# Patient Record
Sex: Male | Born: 1980 | Race: Black or African American | Hispanic: No | Marital: Married | Smoking: Former smoker
Health system: Southern US, Community
[De-identification: ages and names within clinical notes are randomized; demographics above are authoritative.]

## PROBLEM LIST (undated history)

## (undated) DIAGNOSIS — I214 Non-ST elevation (NSTEMI) myocardial infarction: Secondary | ICD-10-CM

## (undated) DIAGNOSIS — I10 Essential (primary) hypertension: Secondary | ICD-10-CM

## (undated) DIAGNOSIS — E119 Type 2 diabetes mellitus without complications: Secondary | ICD-10-CM

## (undated) HISTORY — PX: INCISE AND DRAIN ABCESS: PRO64

## (undated) HISTORY — PX: OTHER SURGICAL HISTORY: SHX169

## (undated) HISTORY — PX: EXTERNAL EAR SURGERY: SHX627

## (undated) HISTORY — DX: Non-ST elevation (NSTEMI) myocardial infarction: I21.4

---

## 2002-06-20 ENCOUNTER — Emergency Department (HOSPITAL_COMMUNITY): Admission: EM | Admit: 2002-06-20 | Discharge: 2002-06-20 | Payer: Self-pay | Admitting: Emergency Medicine

## 2002-11-17 ENCOUNTER — Emergency Department (HOSPITAL_COMMUNITY): Admission: EM | Admit: 2002-11-17 | Discharge: 2002-11-17 | Payer: Self-pay | Admitting: Emergency Medicine

## 2002-12-16 ENCOUNTER — Encounter: Payer: Self-pay | Admitting: *Deleted

## 2002-12-16 ENCOUNTER — Emergency Department (HOSPITAL_COMMUNITY): Admission: EM | Admit: 2002-12-16 | Discharge: 2002-12-16 | Payer: Self-pay | Admitting: *Deleted

## 2003-01-10 ENCOUNTER — Emergency Department (HOSPITAL_COMMUNITY): Admission: EM | Admit: 2003-01-10 | Discharge: 2003-01-10 | Payer: Self-pay | Admitting: *Deleted

## 2003-03-10 ENCOUNTER — Emergency Department (HOSPITAL_COMMUNITY): Admission: EM | Admit: 2003-03-10 | Discharge: 2003-03-10 | Payer: Self-pay | Admitting: *Deleted

## 2003-03-10 ENCOUNTER — Encounter: Payer: Self-pay | Admitting: *Deleted

## 2003-05-01 ENCOUNTER — Emergency Department (HOSPITAL_COMMUNITY): Admission: EM | Admit: 2003-05-01 | Discharge: 2003-05-01 | Payer: Self-pay | Admitting: Emergency Medicine

## 2003-05-01 ENCOUNTER — Encounter: Payer: Self-pay | Admitting: Emergency Medicine

## 2003-05-10 ENCOUNTER — Emergency Department (HOSPITAL_COMMUNITY): Admission: EM | Admit: 2003-05-10 | Discharge: 2003-05-10 | Payer: Self-pay | Admitting: Emergency Medicine

## 2003-11-20 ENCOUNTER — Emergency Department (HOSPITAL_COMMUNITY): Admission: EM | Admit: 2003-11-20 | Discharge: 2003-11-20 | Payer: Self-pay | Admitting: Sports Medicine

## 2005-03-13 ENCOUNTER — Emergency Department (HOSPITAL_COMMUNITY): Admission: EM | Admit: 2005-03-13 | Discharge: 2005-03-13 | Payer: Self-pay | Admitting: Emergency Medicine

## 2006-09-04 ENCOUNTER — Emergency Department (HOSPITAL_COMMUNITY): Admission: EM | Admit: 2006-09-04 | Discharge: 2006-09-04 | Payer: Self-pay | Admitting: Emergency Medicine

## 2006-11-07 ENCOUNTER — Emergency Department (HOSPITAL_COMMUNITY): Admission: EM | Admit: 2006-11-07 | Discharge: 2006-11-07 | Payer: Self-pay | Admitting: Emergency Medicine

## 2006-11-09 ENCOUNTER — Emergency Department (HOSPITAL_COMMUNITY): Admission: EM | Admit: 2006-11-09 | Discharge: 2006-11-09 | Payer: Self-pay | Admitting: Emergency Medicine

## 2007-04-11 ENCOUNTER — Emergency Department (HOSPITAL_COMMUNITY): Admission: EM | Admit: 2007-04-11 | Discharge: 2007-04-11 | Payer: Self-pay | Admitting: Emergency Medicine

## 2007-06-05 ENCOUNTER — Emergency Department (HOSPITAL_COMMUNITY): Admission: EM | Admit: 2007-06-05 | Discharge: 2007-06-05 | Payer: Self-pay | Admitting: Emergency Medicine

## 2008-06-10 ENCOUNTER — Emergency Department (HOSPITAL_COMMUNITY): Admission: EM | Admit: 2008-06-10 | Discharge: 2008-06-10 | Payer: Self-pay | Admitting: Emergency Medicine

## 2009-05-03 ENCOUNTER — Emergency Department (HOSPITAL_COMMUNITY): Admission: EM | Admit: 2009-05-03 | Discharge: 2009-05-03 | Payer: Self-pay | Admitting: Emergency Medicine

## 2010-07-21 ENCOUNTER — Emergency Department (HOSPITAL_COMMUNITY): Admission: EM | Admit: 2010-07-21 | Discharge: 2010-07-21 | Payer: Self-pay | Admitting: Emergency Medicine

## 2010-09-02 DEATH — deceased

## 2010-09-05 ENCOUNTER — Emergency Department (HOSPITAL_COMMUNITY): Admission: EM | Admit: 2010-09-05 | Discharge: 2010-09-05 | Payer: Self-pay | Admitting: Emergency Medicine

## 2011-01-13 LAB — CBC
Platelets: 181 10*3/uL (ref 150–400)
RBC: 4.88 MIL/uL (ref 4.22–5.81)
RDW: 13.6 % (ref 11.5–15.5)
WBC: 8.2 10*3/uL (ref 4.0–10.5)

## 2011-01-13 LAB — COMPREHENSIVE METABOLIC PANEL
ALT: 20 U/L (ref 0–53)
AST: 23 U/L (ref 0–37)
Albumin: 4.2 g/dL (ref 3.5–5.2)
Alkaline Phosphatase: 98 U/L (ref 39–117)
Chloride: 104 mEq/L (ref 96–112)
Potassium: 3.7 mEq/L (ref 3.5–5.1)
Sodium: 139 mEq/L (ref 135–145)
Total Bilirubin: 0.6 mg/dL (ref 0.3–1.2)
Total Protein: 7.2 g/dL (ref 6.0–8.3)

## 2011-01-13 LAB — DIFFERENTIAL
Basophils Relative: 1 % (ref 0–1)
Eosinophils Absolute: 0.1 10*3/uL (ref 0.0–0.7)
Eosinophils Relative: 1 % (ref 0–5)
Monocytes Absolute: 0.7 10*3/uL (ref 0.1–1.0)
Monocytes Relative: 8 % (ref 3–12)
Neutro Abs: 4.9 10*3/uL (ref 1.7–7.7)

## 2011-08-17 LAB — CBC
Hemoglobin: 14.2
MCHC: 33.9
Platelets: 169
RDW: 13.1

## 2011-08-17 LAB — DIFFERENTIAL
Basophils Absolute: 0.1
Eosinophils Absolute: 0.2
Lymphocytes Relative: 21
Neutro Abs: 6.6

## 2011-11-28 ENCOUNTER — Emergency Department (HOSPITAL_COMMUNITY)
Admission: EM | Admit: 2011-11-28 | Discharge: 2011-11-28 | Disposition: A | Discharge status: Home / Self Care | Payer: Self-pay | Attending: Emergency Medicine | Admitting: Emergency Medicine

## 2011-11-28 ENCOUNTER — Encounter (HOSPITAL_COMMUNITY): Payer: Self-pay

## 2011-11-28 DIAGNOSIS — S335XXA Sprain of ligaments of lumbar spine, initial encounter: Secondary | ICD-10-CM | POA: Insufficient documentation

## 2011-11-28 DIAGNOSIS — X500XXA Overexertion from strenuous movement or load, initial encounter: Secondary | ICD-10-CM | POA: Insufficient documentation

## 2011-11-28 DIAGNOSIS — M5380 Other specified dorsopathies, site unspecified: Secondary | ICD-10-CM | POA: Insufficient documentation

## 2011-11-28 DIAGNOSIS — S39012A Strain of muscle, fascia and tendon of lower back, initial encounter: Secondary | ICD-10-CM

## 2011-11-28 DIAGNOSIS — F17200 Nicotine dependence, unspecified, uncomplicated: Secondary | ICD-10-CM | POA: Insufficient documentation

## 2011-11-28 DIAGNOSIS — S161XXA Strain of muscle, fascia and tendon at neck level, initial encounter: Secondary | ICD-10-CM

## 2011-11-28 MED ORDER — IBUPROFEN 800 MG PO TABS: 800.0000 mg | ORAL_TABLET | Freq: Three times a day (TID) | ORAL | Status: AC

## 2011-11-28 MED ORDER — KETOROLAC TROMETHAMINE 60 MG/2ML IM SOLN
60.0000 mg | Freq: Once | INTRAMUSCULAR | Status: AC
  Administered 2011-11-28: 60 mg via INTRAMUSCULAR
  Filled 2011-11-28: qty 2

## 2011-11-28 MED ORDER — CYCLOBENZAPRINE HCL 10 MG PO TABS: 10.0000 mg | ORAL_TABLET | Freq: Three times a day (TID) | ORAL | Status: AC | PRN

## 2011-11-28 MED ORDER — CYCLOBENZAPRINE HCL 10 MG PO TABS
10.0000 mg | ORAL_TABLET | Freq: Once | ORAL | Status: AC
  Administered 2011-11-28: 10 mg via ORAL
  Filled 2011-11-28: qty 1

## 2011-11-28 MED ORDER — HYDROCODONE-ACETAMINOPHEN 5-325 MG PO TABS: 1.0000 | ORAL_TABLET | ORAL | Status: AC | PRN

## 2011-11-28 NOTE — ED Notes (Signed)
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  Pt says this am back pain intensified.

## 2011-11-28 NOTE — ED Provider Notes (Signed)
History     CSN: [id number]  Arrival date & time 11/28/11  8469   First MD Initiated Contact with Patient 11/28/11 [phone]      Chief Complaint  Patient presents with  . Back Pain    (Consider location/radiation/quality/duration/timing/severity/associated sxs/prior treatment) HPI Comments: Patient here after lifting heavy boxes starting Thursday - states he works three jobs, states has also been cutting firewood for at home and is now having worsening pain with movement, denies numbness, reports tingling that occurred "once" and denies weakness.  States knows he is not lifting right.  Patient is a 31 y.o. male presenting with back pain. The history is provided by the patient. No language interpreter was used.  Back Pain  This is a new problem. The current episode started yesterday. The problem occurs constantly. The problem has not changed since onset.The pain is associated with lifting heavy objects. The pain is present in the lumbar spine. The quality of the pain is described as aching and cramping. The pain does not radiate. The pain is at a severity of 6/10. The pain is moderate. The symptoms are aggravated by bending and twisting. The pain is the same all the time. Stiffness is present in the morning. Associated symptoms include tingling. Pertinent negatives include no chest pain, no fever, no numbness, no weight loss, no headaches, no abdominal pain, no abdominal swelling, no bowel incontinence, no perianal numbness, no bladder incontinence, no dysuria, no pelvic pain, no leg pain, no paresthesias, no paresis and no weakness. He has tried NSAIDs for the symptoms. The treatment provided no relief.    History reviewed. No pertinent past medical history.  Past Surgical History  Procedure Date  . External ear surgery     No family history on file.  History  Substance Use Topics  . Smoking status: Current Everyday Smoker  . Smokeless tobacco: Not on file  . Alcohol Use: No       Review of Systems  Constitutional: Negative for fever and weight loss.  Cardiovascular: Negative for chest pain.  Gastrointestinal: Negative for abdominal pain and bowel incontinence.  Genitourinary: Negative for bladder incontinence, dysuria and pelvic pain.  Musculoskeletal: Positive for back pain.  Neurological: Positive for tingling. Negative for weakness, numbness, headaches and paresthesias.  All other systems reviewed and are negative.    Allergies  Review of patient's allergies indicates no known allergies.  Home Medications  No current outpatient prescriptions on file.  BP 131/82  Pulse 61  Temp(Src) 98.5 F (36.9 C) (Oral)  Resp 20  Ht 6\' 3"  (1.905 m)  Wt 235 lb (106.595 kg)  BMI 29.37 kg/m2  SpO2 100%  Physical Exam  Nursing note and vitals reviewed. Constitutional: He is oriented to person, place, and time. He appears well-developed and well-nourished. No distress.  HENT:  Head: Normocephalic and atraumatic.  Right Ear: External ear normal.  Left Ear: External ear normal.  Nose: Nose normal.  Mouth/Throat: Oropharynx is clear and moist. No oropharyngeal exudate.  Eyes: Conjunctivae are normal. Pupils are equal, round, and reactive to light. No scleral icterus.  Neck: Normal range of motion. Neck supple. Muscular tenderness present. No spinous process tenderness present. No tracheal deviation present.  Cardiovascular: Normal rate, regular rhythm and normal heart sounds.  Exam reveals no gallop and no friction rub.   No murmur heard. Pulmonary/Chest: Effort normal and breath sounds normal. No respiratory distress. He exhibits no tenderness.  Abdominal: Soft. Bowel sounds are normal. There is no tenderness.  Musculoskeletal:  Thoracic back: He exhibits pain and spasm. He exhibits normal range of motion, no tenderness and no bony tenderness.       Lumbar back: He exhibits tenderness and spasm. He exhibits normal range of motion and no bony  tenderness.  Lymphadenopathy:    He has no cervical adenopathy.  Neurological: He is alert and oriented to person, place, and time. He has normal reflexes. No cranial nerve deficit. He exhibits normal muscle tone. Coordination normal.       Strength 5/5 bilateral UE and LE  Skin: Skin is warm and dry.  Psychiatric: He has a normal mood and affect. His behavior is normal. Judgment and thought content normal.    ED Course  Procedures (including critical care time)  Labs Reviewed - No data to display No results found.   Lumbar strain    MDM  Patient without alarming signs of cauda equina or epidural abscess or hematoma.  MSK lower back pain.        Izola Price [place], [place] 11/28/11 [phone]

## 2011-11-30 NOTE — ED Provider Notes (Signed)
Medical screening examination/treatment/procedure(s) were performed by non-physician practitioner and as supervising physician I was immediately available for consultation/collaboration.  Kelsie Kramp, MD 11/30/11 1047 

## 2013-07-09 ENCOUNTER — Emergency Department (HOSPITAL_COMMUNITY)
Admission: EM | Admit: 2013-07-09 | Discharge: 2013-07-09 | Disposition: A | Discharge status: Home / Self Care | Payer: Self-pay | Attending: Emergency Medicine | Admitting: Emergency Medicine

## 2013-07-09 ENCOUNTER — Encounter (HOSPITAL_COMMUNITY): Payer: Self-pay | Admitting: Emergency Medicine

## 2013-07-09 DIAGNOSIS — F17200 Nicotine dependence, unspecified, uncomplicated: Secondary | ICD-10-CM | POA: Insufficient documentation

## 2013-07-09 DIAGNOSIS — T63461A Toxic effect of venom of wasps, accidental (unintentional), initial encounter: Secondary | ICD-10-CM | POA: Insufficient documentation

## 2013-07-09 DIAGNOSIS — Y939 Activity, unspecified: Secondary | ICD-10-CM | POA: Insufficient documentation

## 2013-07-09 DIAGNOSIS — Y929 Unspecified place or not applicable: Secondary | ICD-10-CM | POA: Insufficient documentation

## 2013-07-09 DIAGNOSIS — T6391XA Toxic effect of contact with unspecified venomous animal, accidental (unintentional), initial encounter: Secondary | ICD-10-CM | POA: Insufficient documentation

## 2013-07-09 NOTE — ED Provider Notes (Signed)
CSN: [id number]     Arrival date & time 07/09/13  2102 History   First MD Initiated Contact with Patient 07/09/13 2144     Chief Complaint  Patient presents with  . Insect Bite    HPI Pt was seen at 2145. Per pt, c/o sudden onset and resolution of one episode of "insect sting" on his right middle fingertip PTA. Pt states he "felt something on my head" and "brushed it off with my hand" when he "got stung."  States he "didn't know what to do," so he came to the ED for eval. Denies dysphagia, no intra-oral edema, no hoarse voice, no drooling/stridor, no wheezing/SOB, no finger pain, no finger swelling, no rash, no fever.    History reviewed. No pertinent past medical history.  Past Surgical History  Procedure Laterality Date  . External ear surgery      History  Substance Use Topics  . Smoking status: Current Every Day Smoker  . Smokeless tobacco: Not on file  . Alcohol Use: No    Review of Systems ROS: Statement: All systems negative except as marked or noted in the HPI; Constitutional: Negative for fever and chills. ; ; Eyes: Negative for eye pain, redness and discharge. ; ; ENMT: Negative for ear pain, hoarseness, nasal congestion, sinus pressure and sore throat. ; ; Cardiovascular: Negative for chest pain, palpitations, diaphoresis, dyspnea and peripheral edema. ; ; Respiratory: Negative for cough, wheezing and stridor. ; ; Gastrointestinal: Negative for nausea, vomiting, diarrhea, abdominal pain, blood in stool, hematemesis, jaundice and rectal bleeding. . ; ; Genitourinary: Negative for dysuria, flank pain and hematuria. ; ; Musculoskeletal: Negative for back pain and neck pain. Negative for swelling and deformity.; ; Skin: +insect sting. Negative for pruritus, rash, abrasions, blisters, bruising and skin lesion.; ; Neuro: Negative for headache, lightheadedness and neck stiffness. Negative for weakness, altered level of consciousness , altered mental status, extremity weakness,  paresthesias, involuntary movement, seizure and syncope.       Allergies  Review of patient's allergies indicates no known allergies.  Home Medications  No current outpatient prescriptions on file. BP 143/82  Pulse 89  Temp(Src) 98.1 F (36.7 C) (Oral)  Resp 18  SpO2 94% Physical Exam 2150: Physical examination:  Nursing notes reviewed; Vital signs and O2 SAT reviewed;  Constitutional: Well developed, Well nourished, Well hydrated, In no acute distress; Head:  Normocephalic, atraumatic; Eyes: EOMI, PERRL, No scleral icterus; ENMT: TM's clear bilat. +edemetous nasal turbinates bilat with clear rhinorrhea. Mouth and pharynx without lesions. No tonsillar exudates. No intra-oral edema. No submandibular or sublingual edema. No hoarse voice, no drooling, no stridor.  Mouth and pharynx normal, Mucous membranes moist; Neck: Supple, Full range of motion, No lymphadenopathy; Cardiovascular: Regular rate and rhythm, No murmur, rub, or gallop; Respiratory: Breath sounds clear & equal bilaterally, No rales, rhonchi, wheezes.  Speaking full sentences with ease, Normal respiratory effort/excursion; Chest: Nontender, Movement normal;  Extremities: Pulses normal, No tenderness, No edema, No calf edema or asymmetry.; Neuro: AA&Ox3, Major CN grossly intact.  Speech clear. No gross focal motor or sensory deficits in extremities.; Skin: Color normal, Warm, Dry, +right middle fingertip without open wounds, erythema, edema, blister, or ecchymosis. NT to palp. No streaking up finger/hand/arm.    ED Course  Procedures    MDM  MDM Reviewed: previous chart, nursing note and vitals   2155:  Fingertip without localized reaction. No generalized allergic reaction. Tx symptomatically at this time. Dx d/w pt and family.  Questions answered.  Verb understanding, agreeable to d/c home with outpt f/u.      Laray Anger, DO 07/11/13 2150

## 2013-07-09 NOTE — ED Notes (Signed)
Patient complaining of bee sting to finger. No acute distress noted. No breathing difficulty

## 2013-07-09 NOTE — ED Notes (Signed)
Patient given discharge instruction, verbalized understand. Patient ambulatory out of the department.  

## 2016-11-07 ENCOUNTER — Emergency Department (HOSPITAL_COMMUNITY)
Admission: EM | Admit: 2016-11-07 | Discharge: 2016-11-07 | Disposition: A | Discharge status: Home / Self Care | Payer: Managed Care, Other (non HMO) | Attending: Emergency Medicine | Admitting: Emergency Medicine

## 2016-11-07 ENCOUNTER — Encounter (HOSPITAL_COMMUNITY): Payer: Self-pay | Admitting: *Deleted

## 2016-11-07 DIAGNOSIS — L0501 Pilonidal cyst with abscess: Secondary | ICD-10-CM | POA: Insufficient documentation

## 2016-11-07 DIAGNOSIS — L0591 Pilonidal cyst without abscess: Secondary | ICD-10-CM

## 2016-11-07 DIAGNOSIS — F17210 Nicotine dependence, cigarettes, uncomplicated: Secondary | ICD-10-CM | POA: Diagnosis not present

## 2016-11-07 DIAGNOSIS — R51 Headache: Secondary | ICD-10-CM | POA: Diagnosis not present

## 2016-11-07 MED ORDER — HYDROCODONE-ACETAMINOPHEN 5-325 MG PO TABS
1.0000 | ORAL_TABLET | Freq: Once | ORAL | Status: AC
  Administered 2016-11-07: 1 via ORAL
  Filled 2016-11-07: qty 1

## 2016-11-07 MED ORDER — SULFAMETHOXAZOLE-TRIMETHOPRIM 800-160 MG PO TABS
1.0000 | ORAL_TABLET | Freq: Once | ORAL | Status: AC
  Administered 2016-11-07: 1 via ORAL
  Filled 2016-11-07: qty 1

## 2016-11-07 MED ORDER — HYDROCODONE-ACETAMINOPHEN 5-325 MG PO TABS: ORAL_TABLET | ORAL | 0 refills | Status: DC

## 2016-11-07 MED ORDER — LIDOCAINE HCL (PF) 2 % IJ SOLN
10.0000 mL | Freq: Once | INTRAMUSCULAR | Status: DC
  Filled 2016-11-07: qty 10

## 2016-11-07 MED ORDER — SULFAMETHOXAZOLE-TRIMETHOPRIM 800-160 MG PO TABS: 1.0000 | ORAL_TABLET | Freq: Two times a day (BID) | ORAL | 0 refills | Status: AC

## 2016-11-07 NOTE — Discharge Instructions (Signed)
Warm water soaks 2-3 times a day.  Packing will need to be removed in 2 days.  Call the surgeon listed above to arrange a follow-up appt.

## 2016-11-07 NOTE — ED Provider Notes (Signed)
[facility]PT Provider Note   CSN: [id number] Arrival date & time: 11/07/16  2024     History   Chief Complaint Chief Complaint  Patient presents with  . Cyst    HPI Chad Blankenship is a 36 y.o. male.  HPI  Chad Blankenship is a 36 y.o. male who presents to the Emergency Department complaining of a recurrent cyst to the top of his buttocks.  He states that he had this area incised and drained several months ago and began to notice redness, pain and swelling to the same area nearly one week ago.  Pain is worse with movement and sitting.  He endorses associated headache, fatigue and mild body aches.  He denies fever, chills, abdominal pain and vomiting.     History reviewed. No pertinent past medical history.  There are no active problems to display for this patient.   Past Surgical History:  Procedure Laterality Date  . EXTERNAL EAR SURGERY         Home Medications    Prior to Admission medications   Not on File    Family History History reviewed. No pertinent family history.  Social History Social History  Substance Use Topics  . Smoking status: Current Every Day Smoker    Packs/day: 0.50    Types: Cigarettes  . Smokeless tobacco: Never Used  . Alcohol use No     Allergies   Patient has no known allergies.   Review of Systems Review of Systems  Constitutional: Positive for fatigue. Negative for chills and fever.  Gastrointestinal: Negative for abdominal pain, constipation, nausea and vomiting.  Genitourinary: Negative for difficulty urinating.  Musculoskeletal: Positive for myalgias. Negative for arthralgias and joint swelling.  Skin: Positive for color change.       Abscess   Neurological: Positive for headaches. Negative for dizziness, weakness and numbness.  Hematological: Negative for adenopathy.  All other systems reviewed and are negative.    Physical Exam Updated Vital Signs BP 121/78 (BP Location: Right Arm)   Pulse 87    Temp 97.7 F (36.5 C) (Oral)   Resp 18   Ht 6\' 3"  (1.905 m)   Wt 103.9 kg   SpO2 99%   BMI 28.62 kg/m   Physical Exam  Constitutional: He is oriented to person, place, and time. He appears well-developed and well-nourished. No distress.  HENT:  Head: Normocephalic and atraumatic.  Cardiovascular: Normal rate and normal heart sounds.   No murmur heard. Pulmonary/Chest: Effort normal and breath sounds normal. No respiratory distress.  Abdominal: Soft. He exhibits no distension. There is no tenderness.  Musculoskeletal: Normal range of motion.  Neurological: He is alert and oriented to person, place, and time. He exhibits normal muscle tone. Coordination normal.  Skin: Skin is warm and dry. There is erythema.  Tenderness, mild erythema and fluctuance of the upper gluteal cleft.  No drainage.   Nursing note and vitals reviewed.    ED Treatments / Results  Labs (all labs ordered are listed, but only abnormal results are displayed) Labs Reviewed - No data to display  EKG  EKG Interpretation None       Radiology No results found.  Procedures Procedures (including critical care time)   INCISION AND DRAINAGE Performed by: Maxwell Caul. Consent: Verbal consent obtained. Risks and benefits: risks, benefits and alternatives were discussed Type: abscess  Body area: gluteal cleft  Anesthesia: local infiltration  Incision was made with a #11 scalpel.  Local anesthetic: lidocaine 2 % w/o epinephrine  Anesthetic total: 3 ml  Complexity: complex Blunt dissection to break up loculations  Drainage: purulent  Drainage amount: moderate  Packing material: 1/2 in iodoform gauze  Patient tolerance: Patient tolerated the procedure well with no immediate complications.     Medications Ordered in ED Medications  lidocaine (XYLOCAINE) 2 % injection 10 mL (10 mLs Intradermal Handoff 11/07/16 2139)  sulfamethoxazole-trimethoprim (BACTRIM DS,SEPTRA DS) 800-160 MG per  tablet 1 tablet (not administered)  HYDROcodone-acetaminophen (NORCO/VICODIN) 5-325 MG per tablet 1 tablet (not administered)     Initial Impression / Assessment and Plan / ED Course  I have reviewed the triage vital signs and the nursing notes.  Pertinent labs & imaging results that were available during my care of the patient were reviewed by me and considered in my medical decision making (see chart for details).  Clinical Course     Pt is well appearing.  Non-toxic.  Recurrent pilonidal cyst.  I&D performed.  rx for bactrim.  Pt agrees to warm wet soaks, referral given for general surgery since this is a recurrent problem for him  Final Clinical Impressions(s) / ED Diagnoses   Final diagnoses:  Pilonidal cyst    New Prescriptions New Prescriptions   No medications on file     Rosey Bathammy Jesse Nosbisch, PA-C 11/07/16 2202    Bethann BerkshireJoseph Zammit, MD 11/08/16 2351

## 2016-11-07 NOTE — ED Triage Notes (Signed)
Pt c/o a cyst on his tailbone/sacrum. Pt states he has had a pilonidal cyst before. Pt states this came up on Sunday or Monday. Pt c/o slight headache and fatigue.

## 2016-11-15 ENCOUNTER — Encounter (HOSPITAL_COMMUNITY): Payer: Self-pay | Admitting: Emergency Medicine

## 2016-11-15 ENCOUNTER — Emergency Department (HOSPITAL_COMMUNITY)
Admission: EM | Admit: 2016-11-15 | Discharge: 2016-11-15 | Disposition: A | Discharge status: Home / Self Care | Payer: Managed Care, Other (non HMO) | Attending: Emergency Medicine | Admitting: Emergency Medicine

## 2016-11-15 DIAGNOSIS — F17210 Nicotine dependence, cigarettes, uncomplicated: Secondary | ICD-10-CM | POA: Diagnosis not present

## 2016-11-15 DIAGNOSIS — Y929 Unspecified place or not applicable: Secondary | ICD-10-CM | POA: Diagnosis not present

## 2016-11-15 DIAGNOSIS — X509XXA Other and unspecified overexertion or strenuous movements or postures, initial encounter: Secondary | ICD-10-CM | POA: Insufficient documentation

## 2016-11-15 DIAGNOSIS — S3992XA Unspecified injury of lower back, initial encounter: Secondary | ICD-10-CM | POA: Diagnosis present

## 2016-11-15 DIAGNOSIS — Y9389 Activity, other specified: Secondary | ICD-10-CM | POA: Insufficient documentation

## 2016-11-15 DIAGNOSIS — Y999 Unspecified external cause status: Secondary | ICD-10-CM | POA: Insufficient documentation

## 2016-11-15 DIAGNOSIS — S39012A Strain of muscle, fascia and tendon of lower back, initial encounter: Secondary | ICD-10-CM | POA: Diagnosis not present

## 2016-11-15 MED ORDER — OXYCODONE-ACETAMINOPHEN 5-325 MG PO TABS
1.0000 | ORAL_TABLET | Freq: Once | ORAL | Status: AC
Start: 2016-11-15 — End: 2016-11-15
  Administered 2016-11-15: 1 via ORAL
  Filled 2016-11-15: qty 1

## 2016-11-15 MED ORDER — METHOCARBAMOL 500 MG PO TABS: 500.0000 mg | ORAL_TABLET | Freq: Three times a day (TID) | ORAL | 0 refills | Status: DC | PRN

## 2016-11-15 MED ORDER — PREDNISONE 20 MG PO TABS: 40.0000 mg | ORAL_TABLET | Freq: Every day | ORAL | 0 refills | Status: DC

## 2016-11-15 NOTE — ED Provider Notes (Signed)
[facility] Provider Note   CSN: [id number] Arrival date & time: 11/15/16  [id number]   By signing my name below, I, Bobbie Stackhristopher Reid, attest that this documentation has been prepared under the direction and in the presence of Benjiman CoreNathan Sanaa Zilberman, MD. Electronically Signed: Bobbie Stackhristopher Reid, Scribe. 11/15/16. 10:14 AM. History   Chief Complaint Chief Complaint  Patient presents with  . Back Pain    The history is provided by the patient. No language interpreter was used.    HPI Comments: Chad Blankenship is a 36 y.o. male who presents to the Emergency Department complaining of severe lower back and leg pain since yesterday. Patient reports that he was helping a friend move into their house yesterday when he started to experience this severe pain. Patient states that he brought a big table into the house without any complications. The pain didn't onset until a few minutes later. He reports having leg pain in the past but states this pain is different. Patient reports pain when straining to use the bathroom. He denies urinary incontinence. Patient also denies hx of cancer.  History reviewed. No pertinent past medical history.  There are no active problems to display for this patient.   Past Surgical History:  Procedure Laterality Date  . EXTERNAL EAR SURGERY    . INCISE AND DRAIN ABCESS         Home Medications    Prior to Admission medications   Medication Sig Start Date End Date Taking? Authorizing Provider  HYDROcodone-acetaminophen (NORCO/VICODIN) 5-325 MG tablet Take one tab po q 4-6 hrs prn pain 11/07/16   Tammy Triplett, PA-C  methocarbamol (ROBAXIN) 500 MG tablet Take 1 tablet (500 mg total) by mouth every 8 (eight) hours as needed for muscle spasms. 11/15/16   Benjiman CoreNathan Leelyn Jasinski, MD  predniSONE (DELTASONE) 20 MG tablet Take 2 tablets (40 mg total) by mouth daily. 11/15/16   Benjiman CoreNathan Krikor Willet, MD    Family History No family history on file.  Social History Social History   Substance Use Topics  . Smoking status: Current Every Day Smoker    Packs/day: 0.50    Types: Cigarettes  . Smokeless tobacco: Never Used  . Alcohol use No     Allergies   Patient has no known allergies.   Review of Systems Review of Systems  Cardiovascular: Negative for chest pain.  Gastrointestinal: Negative for abdominal pain.  Genitourinary: Negative for enuresis.  Musculoskeletal: Positive for back pain (Lower).  All other systems reviewed and are negative.    Physical Exam Updated Vital Signs BP 144/92 (BP Location: Right Arm)   Pulse 67   Temp 98.2 F (36.8 C) (Oral)   Resp 16   Ht 6\' 2"  (1.88 m)   Wt 229 lb (103.9 kg)   SpO2 100%   BMI 29.40 kg/m   Physical Exam  Constitutional: He is oriented to person, place, and time. He appears well-developed and well-nourished. No distress.  HENT:  Head: Normocephalic and atraumatic.  Eyes: Conjunctivae and EOM are normal. Pupils are equal, round, and reactive to light.  Neck: Normal range of motion. Neck supple.  Cardiovascular: Normal rate and regular rhythm.  Exam reveals no gallop and no friction rub.   No murmur heard. Pulmonary/Chest: Effort normal and breath sounds normal. He has no wheezes. He has no rales.  Abdominal: Soft. Bowel sounds are normal. There is no tenderness.  Musculoskeletal: He exhibits tenderness.  Pain in straight leg raise on right. Less pain on left side. Paralumbar tenderness.  Neurological:  He is alert and oriented to person, place, and time. No cranial nerve deficit.  Perineal sensation intact.  Skin: Skin is warm and dry.  Psychiatric: He has a normal mood and affect.     ED Treatments / Results  DIAGNOSTIC STUDIES: Oxygen Saturation is 100% on RA, normal by my interpretation.    COORDINATION OF CARE: 9:53 AM Discussed treatment plan with pt at bedside and pt agreed to plan.  Labs (all labs ordered are listed, but only abnormal results are displayed) Labs Reviewed - No data  to display  EKG  EKG Interpretation None       Radiology No results found.  Procedures Procedures (including critical care time)  Medications Ordered in ED Medications  oxyCODONE-acetaminophen (PERCOCET/ROXICET) 5-325 MG per tablet 1 tablet (1 tablet Oral Given 11/15/16 1017)     Initial Impression / Assessment and Plan / ED Course  I have reviewed the triage vital signs and the nursing notes.  Pertinent labs & imaging results that were available during my care of the patient were reviewed by me and considered in my medical decision making (see chart for details).  Clinical Course     Patient with likely musculoskeletal back pain. No red flags for severe injury. Will treat symptomatically and will follow-up as needed.  Final Clinical Impressions(s) / ED Diagnoses   Final diagnoses:  Strain of lumbar region, initial encounter    New Prescriptions Discharge Medication List as of 11/15/2016 10:02 AM    START taking these medications   Details  methocarbamol (ROBAXIN) 500 MG tablet Take 1 tablet (500 mg total) by mouth every 8 (eight) hours as needed for muscle spasms., Starting Sun 11/15/2016, Print    predniSONE (DELTASONE) 20 MG tablet Take 2 tablets (40 mg total) by mouth daily., Starting Sun 11/15/2016, Print       I personally performed the services described in this documentation, which was scribed in my presence. The recorded information has been reviewed and is accurate.      Benjiman Core, MD 11/15/16 [phone]

## 2016-11-15 NOTE — ED Triage Notes (Signed)
Patient c/o low back pain that started yesterday after helping a friend move. Per patient radiates down legs bilaterally. Denies any complications with BMs or urination.

## 2017-06-15 ENCOUNTER — Encounter (HOSPITAL_COMMUNITY): Payer: Self-pay | Admitting: *Deleted

## 2017-06-15 ENCOUNTER — Emergency Department (HOSPITAL_COMMUNITY)
Admission: EM | Admit: 2017-06-15 | Discharge: 2017-06-15 | Disposition: A | Discharge status: Home / Self Care | Payer: Managed Care, Other (non HMO) | Attending: Emergency Medicine | Admitting: Emergency Medicine

## 2017-06-15 DIAGNOSIS — F17210 Nicotine dependence, cigarettes, uncomplicated: Secondary | ICD-10-CM | POA: Diagnosis not present

## 2017-06-15 DIAGNOSIS — Z79899 Other long term (current) drug therapy: Secondary | ICD-10-CM | POA: Insufficient documentation

## 2017-06-15 DIAGNOSIS — L0501 Pilonidal cyst with abscess: Secondary | ICD-10-CM

## 2017-06-15 DIAGNOSIS — L0231 Cutaneous abscess of buttock: Secondary | ICD-10-CM | POA: Diagnosis present

## 2017-06-15 MED ORDER — LIDOCAINE-EPINEPHRINE (PF) 1 %-1:200000 IJ SOLN
INTRAMUSCULAR | Status: AC
  Filled 2017-06-15: qty 30

## 2017-06-15 MED ORDER — NAPROXEN 500 MG PO TABS: 500.0000 mg | ORAL_TABLET | Freq: Two times a day (BID) | ORAL | 0 refills | Status: DC

## 2017-06-15 MED ORDER — HYDROCODONE-ACETAMINOPHEN 5-325 MG PO TABS
1.0000 | ORAL_TABLET | Freq: Once | ORAL | Status: AC
  Administered 2017-06-15: 1 via ORAL
  Filled 2017-06-15: qty 1

## 2017-06-15 MED ORDER — POVIDONE-IODINE 10 % EX SOLN
CUTANEOUS | Status: AC
  Filled 2017-06-15: qty 15

## 2017-06-15 NOTE — ED Triage Notes (Signed)
Pt c/o abscess to sacral area x 2 days

## 2017-06-15 NOTE — ED Provider Notes (Signed)
[facility] DEPT Provider Note   CSN: [id number] Arrival date & time: 06/15/17  0506     History   Chief Complaint Chief Complaint  Patient presents with  . Abscess    HPI Ronald LoboLeonard Forcier is a 36 y.o. male.  HPI  This a 36 year old male who presents with right buttock pain. Patient with history of pilonidal abscess requiring prior incision and drainage 2. Reports 2 to 3 day history of worsening right buttock pain and concerns for repeat abscess. He denies any fevers or systemic symptoms. Current pain is 8 out of 10. He has not taken anything for the pain. He has not noted any drainage.  History reviewed. No pertinent past medical history.  There are no active problems to display for this patient.   Past Surgical History:  Procedure Laterality Date  . EXTERNAL EAR SURGERY    . INCISE AND DRAIN ABCESS         Home Medications    Prior to Admission medications   Medication Sig Start Date End Date Taking? Authorizing Provider  naproxen (NAPROSYN) 500 MG tablet Take 1 tablet (500 mg total) by mouth 2 (two) times daily. 06/15/17   Rebekka Lobello, Mayer Maskerourtney F, MD    Family History History reviewed. No pertinent family history.  Social History Social History  Substance Use Topics  . Smoking status: Current Every Day Smoker    Packs/day: 0.50    Types: Cigarettes  . Smokeless tobacco: Never Used  . Alcohol use No     Allergies   Patient has no known allergies.   Review of Systems Review of Systems  Constitutional: Negative for chills and fever.  Skin: Negative for color change.       Abscess  All other systems reviewed and are negative.    Physical Exam Updated Vital Signs BP 127/87   Pulse 80   Temp 98 F (36.7 C)   Resp 18   Ht 6\' 3"  (1.905 m)   Wt 108 kg (238 lb)   SpO2 98%   BMI 29.75 kg/m   Physical Exam  Constitutional: He is oriented to person, place, and time. He appears well-developed and well-nourished. No distress.  HENT:  Head:  Normocephalic and atraumatic.  Cardiovascular: Normal rate and regular rhythm.   Pulmonary/Chest: Effort normal. No respiratory distress.  Abdominal: Soft. There is no tenderness.  Genitourinary:     Genitourinary Comments: Large area of induration/fluctuance and tenderness to palpation over the right buttock just adjacent to the superior gluteal cleft, scarring noted from prior incisions, no overlying erythema or warmth  Musculoskeletal: He exhibits no edema.  Lymphadenopathy:    He has no cervical adenopathy.  Neurological: He is alert and oriented to person, place, and time.  Skin: Skin is warm and dry.  Psychiatric: He has a normal mood and affect.  Nursing note and vitals reviewed.    ED Treatments / Results  Labs (all labs ordered are listed, but only abnormal results are displayed) Labs Reviewed - No data to display  EKG  EKG Interpretation None       Radiology No results found.  Procedures Procedures (including critical care time)  INCISION AND DRAINAGE Performed by: Ross MarcusHORTON, Zahari Xiang F Consent: Verbal consent obtained. Risks and benefits: risks, benefits and alternatives were discussed Type: abscess  Body area: buttock/pilonidal  Anesthesia: local infiltration  Incision was made with a scalpel.  Local anesthetic: lidocaine 1% w epinephrine  Anesthetic total: 8 ml  Complexity: complex Blunt dissection to break up loculations  Drainage: purulent  Drainage amount: copious  Packing material: 1/4 in iodoform gauze  Patient tolerance: Patient tolerated the procedure well with no immediate complications.     Medications Ordered in ED Medications  povidone-iodine (BETADINE) 10 % external solution (not administered)  lidocaine-EPINEPHrine (XYLOCAINE-EPINEPHrine) 1 %-1:200000 (PF) injection (not administered)  HYDROcodone-acetaminophen (NORCO/VICODIN) 5-325 MG per tablet 1 tablet (1 tablet Oral Given 06/15/17 0549)     Initial Impression /  Assessment and Plan / ED Course  I have reviewed the triage vital signs and the nursing notes.  Pertinent labs & imaging results that were available during my care of the patient were reviewed by me and considered in my medical decision making (see chart for details).     Patient presents with recurrent pilonidal abscess. No overlying skin changes to suggest cellulitis. He is otherwise nontoxic. Incision and drainage performed. Packing placed. Recommend surgical follow-up as patient may need formal excision to prevent recurrence. No indication for antibiotics at this time.  After history, exam, and medical workup I feel the patient has been appropriately medically screened and is safe for discharge home. Pertinent diagnoses were discussed with the patient. Patient was given return precautions.     Final Clinical Impressions(s) / ED Diagnoses   Final diagnoses:  Pilonidal abscess    New Prescriptions New Prescriptions   NAPROXEN (NAPROSYN) 500 MG TABLET    Take 1 tablet (500 mg total) by mouth 2 (two) times daily.     Shon Baton, MD 06/15/17 [phone]

## 2017-06-15 NOTE — Discharge Instructions (Signed)
You were seen today for a pilonidal abscess. This was drained. You need to follow-up with general surgery for recommendations regarding definitive care. Wound check and packing removal recommended in 2-3 days.

## 2017-06-17 ENCOUNTER — Ambulatory Visit (INDEPENDENT_AMBULATORY_CARE_PROVIDER_SITE_OTHER): Payer: 59 | Admitting: General Surgery

## 2017-06-17 ENCOUNTER — Encounter: Payer: Self-pay | Admitting: General Surgery

## 2017-06-17 VITALS — BP 127/69 | HR 60 | Temp 98.4°F | Resp 18 | Ht 75.0 in | Wt 230.0 lb

## 2017-06-17 DIAGNOSIS — L0591 Pilonidal cyst without abscess: Secondary | ICD-10-CM

## 2017-06-17 NOTE — Patient Instructions (Signed)
Incision and Drainage of a Pilonidal Cyst Incision and drainage is a surgical procedure to open and drain a fluid-filled sac that forms around a hair follicle in the tailbone area between your buttocks (pilonidal cyst). You may need this procedure if the cyst becomes painful, swollen, or infected. There are three types of procedures that may be done. The type of procedure you have depends on the size and severity of your infected cyst. The procedure may be:  Incision and drainage with a special type of bandage (wound packing). Packing is used for wounds that are deep or tunnel under the skin.  Marsupialization. In this procedure, the cyst will be opened and kept open. The edges of the incision will be stitched together to make a pocket.  Incision and drainage without wound packing.  Tell a health care provider about:  Any allergies you have.  All medicines you are taking, including vitamins, herbs, eye drops, creams, and over-the-counter medicines.  Any problems you or family members have had with anesthetic medicines.  Any blood disorders you have.  Any surgeries you have had.  Any medical conditions you have. What are the risks? Generally, this is a safe procedure. However, problems can occur and include:  Infection.  Bleeding.  Having another cyst develop.  Need for more surgery.  What happens before the procedure?  Ask your health care provider about: ? Changing or stopping your regular medicines. This is especially important if you are taking diabetes medicines or blood thinners. ? Taking medicines such as aspirin and ibuprofen. These medicines can thin your blood. Do not take these medicines before your procedure if your health care provider tells you not to. ? Taking antibiotics before surgery to control the infection.  Do not eat or drink anything for 6?8 hours before the procedure if you are having general anesthesia.  Take a shower the night before the procedure  to clean your buttocks area. Take another shower in the morning before surgery.  Plan to have someone take you home after the procedure. What happens during the procedure?  You will have an IV tube inserted in a vein in your hand or arm.  You will be given one of the following: ? A medicine that numbs the area (local anesthetic). ? A medicine that makes you go to sleep (general anesthetic).  You also may be given medicine to help you relax during the procedure (sedative).  You will lie face down on the operating table.  Your buttocks area may be shaved.  Tape may be used to spread your buttocks.  Germ-killing solution (antiseptic) may be used to clean the area. Incision and Drainage With Wound Packing:  Your surgeon will make a surgical cut (incision) over the cyst to open it.  A probe may be used to see if there are tunnels extending away from the cyst under your skin.  Fluid or pus inside the cyst will be drained.  The cyst will be flushed out with a germ-free (sterile) solution.  Packing will be placed into the open cyst. This keeps it open and draining after surgery.  The area will be covered with a bandage (dressing). Marsupialization:  Your surgeon will make a surgical cut (incision) over the cyst to open it.  A probe may be used to see if there are tunnels extending away from the cyst under your skin.  Fluid or pus inside the cyst will be drained.  The cyst will be flushed out with a germ-free (sterile) solution.  The edges of the incision will be stitched (sutured) to the skin to keep it wide open. The cyst will not be packed.  A rolled-up bandage (dressing) will be taped over the incision. Incision and Drainage Without Packing:  Your surgeon will make a surgical cut (incision) over the cyst to open it.  A probe may be used to see if there are tunnels extending away from the cyst under your skin.  Fluid or pus inside the cyst will be drained.  The cyst  will be flushed out with a germ-free (sterile) solution.  Your surgeon may also remove the tissue around the opened cyst.  The incision then will be closed with stitches (sutures). It will not be left open, and packing will not be used.  A bandage (dressing) will be put over the incision area. What happens after the procedure?  If you had general anesthesia, you will be taken to a recovery area. Your blood pressure, heart rate, breathing rate, and blood oxygen level will be monitored often until the medicines you were given have worn off.  It is normal to have some pain after this procedure. You may be given pain medicine.  Your IV tube can be taken out after you have recovered and your pain is under control. This information is not intended to replace advice given to you by your health care provider. Make sure you discuss any questions you have with your health care provider. Document Released: 05/16/2014 Document Revised: 03/26/2016 Document Reviewed: 03/08/2014 Elsevier Interactive Patient Education  [employer].

## 2017-06-17 NOTE — Progress Notes (Signed)
Ronald LoboLeonard Ducat; [id number]; May 03, 1981   HPI Patient is a 36 year old black male who was referred here from the emergency room for evaluation and treatment of a pilonidal cyst.  Patient states he has had 3 episodes over the past 2 years in the pilonidal region requiring incision and drainage.  He last underwent with drainage one week ago.  He was started on antibiotic.  He currently has pain of 4 out of 10. History reviewed. No pertinent past medical history.  Past Surgical History:  Procedure Laterality Date  . EXTERNAL EAR SURGERY    . INCISE AND DRAIN ABCESS      History reviewed. No pertinent family history.  Current Outpatient Prescriptions on File Prior to Visit  Medication Sig Dispense Refill  . naproxen (NAPROSYN) 500 MG tablet Take 1 tablet (500 mg total) by mouth 2 (two) times daily. 30 tablet 0   No current facility-administered medications on file prior to visit.     No Known Allergies  History  Alcohol Use No    History  Smoking Status  . Current Every Day Smoker  . Packs/day: 0.50  . Types: Cigarettes  Smokeless Tobacco  . Never Used    Review of Systems  Constitutional: Positive for malaise/fatigue.  HENT: Negative.   Eyes: Negative.   Respiratory: Negative.   Cardiovascular: Negative.   Gastrointestinal: Negative.   Genitourinary: Negative.   Musculoskeletal: Negative.   Skin: Negative.   Neurological: Negative.   Endo/Heme/Allergies: Negative.   Psychiatric/Behavioral: Negative.     Objective   Vitals:   06/17/17 1143  BP: 127/69  Pulse: 60  Resp: 18  Temp: 98.4 F (36.9 C)    Physical Exam  Constitutional: He is oriented to person, place, and time and well-developed, well-nourished, and in no distress.  HENT:  Head: Normocephalic and atraumatic.  Cardiovascular: Normal rate, regular rhythm and normal heart sounds.   No murmur heard. Pulmonary/Chest: Effort normal and breath sounds normal. He has no wheezes. He has no rales.   Neurological: He is alert and oriented to person, place, and time.  Skin: Skin is warm and dry.  A small incision is noted over the pilonidal region with mild induration but no purulent drainage present.  Scarring is present.  Vitals reviewed. ER note reviewed  Assessment   pilonidal cyst Plan    patient should continue local wound care and finish antibiotic course.  He will need formal excision of the pilonidal cyst once the infection has resolved.  He is to follow-up here in 1 month.

## 2017-07-15 ENCOUNTER — Ambulatory Visit: Payer: Managed Care, Other (non HMO) | Admitting: General Surgery

## 2017-07-20 ENCOUNTER — Ambulatory Visit: Payer: Managed Care, Other (non HMO) | Admitting: General Surgery

## 2017-07-22 ENCOUNTER — Encounter: Payer: Self-pay | Admitting: General Surgery

## 2017-07-22 ENCOUNTER — Ambulatory Visit (INDEPENDENT_AMBULATORY_CARE_PROVIDER_SITE_OTHER): Payer: 59 | Admitting: General Surgery

## 2017-07-22 VITALS — BP 139/80 | HR 66 | Temp 99.1°F | Resp 18 | Ht 75.0 in | Wt 232.0 lb

## 2017-07-22 DIAGNOSIS — L0591 Pilonidal cyst without abscess: Secondary | ICD-10-CM

## 2017-07-22 NOTE — Progress Notes (Signed)
Subjective:     Chad Blankenship  Here for follow-up of his infected pilonidal cyst. He has finished his antibiotic course. He has no drainage or swelling present. Objective:    BP 139/80   Pulse 66   Temp 99.1 F (37.3 C)   Resp 18   Ht  (1.905 m)   Wt 232 lb (105.2 kg)   BMI 29.00 kg/m   General:  alert, cooperative and no distress  Pilonidal cyst infection has resolved. No residual abscess or erythema noted.     Assessment:    Pilonidal cyst, no active infection    Plan:   Patient is scheduled for excision of the pilonidal cyst on 07/28/2017. The risks and benefits of the procedure including bleeding, infection, and recurrence of the cyst were fully explained to the patient, who gave informed consent.

## 2017-07-22 NOTE — H&P (Signed)
Chad Blankenship; [id number]; 1981-07-29   HPI Patient is a 36 year old black male who was referred here from the emergency room for evaluation and treatment of a pilonidal cyst.  Patient states he has had 3 episodes over the past 2 years in the pilonidal region requiring incision and drainage.  He last underwent with drainage one week ago.  He was started on antibiotic.  He currently has pain of 4 out of 10. History reviewed. No pertinent past medical history.  Past Surgical History:  Procedure Laterality Date  . EXTERNAL EAR SURGERY    . INCISE AND DRAIN ABCESS      History reviewed. No pertinent family history.  Current Outpatient Prescriptions on File Prior to Visit  Medication Sig Dispense Refill  . naproxen (NAPROSYN) 500 MG tablet Take 1 tablet (500 mg total) by mouth 2 (two) times daily. 30 tablet 0   No current facility-administered medications on file prior to visit.     No Known Allergies  History  Alcohol Use No    History  Smoking Status  . Current Every Day Smoker  . Packs/day: 0.50  . Types: Cigarettes  Smokeless Tobacco  . Never Used    Review of Systems  Constitutional: Positive for malaise/fatigue.  HENT: Negative.   Eyes: Negative.   Respiratory: Negative.   Cardiovascular: Negative.   Gastrointestinal: Negative.   Genitourinary: Negative.   Musculoskeletal: Negative.   Skin: Negative.   Neurological: Negative.   Endo/Heme/Allergies: Negative.   Psychiatric/Behavioral: Negative.     Objective   Vitals:   06/17/17 1143  BP: 127/69  Pulse: 60  Resp: 18  Temp: 98.4 F (36.9 C)    Physical Exam  Constitutional: He is oriented to person, place, and time and well-developed, well-nourished, and in no distress.  HENT:  Head: Normocephalic and atraumatic.  Cardiovascular: Normal rate, regular rhythm and normal heart sounds.   No murmur heard. Pulmonary/Chest: Effort normal and breath sounds normal. He has no wheezes. He has no rales.   Neurological: He is alert and oriented to person, place, and time.  Skin: Skin is warm and dry.  A small incision is noted over the pilonidal region with mild induration but no purulent drainage present.  Scarring is present.  Vitals reviewed. ER note reviewed  Assessment   pilonidal cyst Plan   Patient is scheduled for excision of pilonidal cyst on 07/28/2017. The risks and benefits of the procedure including bleeding, infection, and recurrence of the cyst were fully explained to the patient, who gave informed consent.

## 2017-07-23 NOTE — Patient Instructions (Signed)
Chad Blankenship  07/23/2017     @PREFPERIOPPHARMACY @   Your procedure is scheduled on  07/28/2017   Report to [facility] at  930  A.M.  Call this number if you have problems the morning of surgery:  [phone]   Remember:  Do not eat food or drink liquids after midnight.  Take these medicines the morning of surgery with A SIP OF WATER  None   Do not wear jewelry, make-up or nail polish.  Do not wear lotions, powders, or perfumes, or deoderant.  Do not shave 48 hours prior to surgery.  Men may shave face and neck.  Do not bring valuables to the hospital.  [facility] is not responsible for any belongings or valuables.  Contacts, dentures or bridgework may not be worn into surgery.  Leave your suitcase in the car.  After surgery it may be brought to your room.  For patients admitted to the hospital, discharge time will be determined by your treatment team.  Patients discharged the day of surgery will not be allowed to drive home.   Name and phone number of your driver:   family Special instructions:  None  Please read over the following fact sheets that you were given. Anesthesia Post-op Instructions and Care and Recovery After Surgery      Incision and Drainage of a Pilonidal Cyst Incision and drainage is a surgical procedure to open and drain a fluid-filled sac that forms around a hair follicle in the tailbone area between your buttocks (pilonidal cyst). You may need this procedure if the cyst becomes painful, swollen, or infected. There are three types of procedures that may be done. The type of procedure you have depends on the size and severity of your infected cyst. The procedure may be:  Incision and drainage with a special type of bandage (wound packing). Packing is used for wounds that are deep or tunnel under the skin.  Marsupialization. In this procedure, the cyst will be opened and kept open. The edges of the incision will be stitched together  to make a pocket.  Incision and drainage without wound packing.  Tell a health care provider about:  Any allergies you have.  All medicines you are taking, including vitamins, herbs, eye drops, creams, and over-the-counter medicines.  Any problems you or family members have had with anesthetic medicines.  Any blood disorders you have.  Any surgeries you have had.  Any medical conditions you have. What are the risks? Generally, this is a safe procedure. However, problems can occur and include:  Infection.  Bleeding.  Having another cyst develop.  Need for more surgery.  What happens before the procedure?  Ask your health care provider about: ? Changing or stopping your regular medicines. This is especially important if you are taking diabetes medicines or blood thinners. ? Taking medicines such as aspirin and ibuprofen. These medicines can thin your blood. Do not take these medicines before your procedure if your health care provider tells you not to. ? Taking antibiotics before surgery to control the infection.  Do not eat or drink anything for 6?8 hours before the procedure if you are having general anesthesia.  Take a shower the night before the procedure to clean your buttocks area. Take another shower in the morning before surgery.  Plan to have someone take you home after the procedure. What happens during the procedure?  You will have an IV tube inserted in  a vein in your hand or arm.  You will be given one of the following: ? A medicine that numbs the area (local anesthetic). ? A medicine that makes you go to sleep (general anesthetic).  You also may be given medicine to help you relax during the procedure (sedative).  You will lie face down on the operating table.  Your buttocks area may be shaved.  Tape may be used to spread your buttocks.  Germ-killing solution (antiseptic) may be used to clean the area. Incision and Drainage With Wound  Packing:  Your surgeon will make a surgical cut (incision) over the cyst to open it.  A probe may be used to see if there are tunnels extending away from the cyst under your skin.  Fluid or pus inside the cyst will be drained.  The cyst will be flushed out with a germ-free (sterile) solution.  Packing will be placed into the open cyst. This keeps it open and draining after surgery.  The area will be covered with a bandage (dressing). Marsupialization:  Your surgeon will make a surgical cut (incision) over the cyst to open it.  A probe may be used to see if there are tunnels extending away from the cyst under your skin.  Fluid or pus inside the cyst will be drained.  The cyst will be flushed out with a germ-free (sterile) solution.  The edges of the incision will be stitched (sutured) to the skin to keep it wide open. The cyst will not be packed.  A rolled-up bandage (dressing) will be taped over the incision. Incision and Drainage Without Packing:  Your surgeon will make a surgical cut (incision) over the cyst to open it.  A probe may be used to see if there are tunnels extending away from the cyst under your skin.  Fluid or pus inside the cyst will be drained.  The cyst will be flushed out with a germ-free (sterile) solution.  Your surgeon may also remove the tissue around the opened cyst.  The incision then will be closed with stitches (sutures). It will not be left open, and packing will not be used.  A bandage (dressing) will be put over the incision area. What happens after the procedure?  If you had general anesthesia, you will be taken to a recovery area. Your blood pressure, heart rate, breathing rate, and blood oxygen level will be monitored often until the medicines you were given have worn off.  It is normal to have some pain after this procedure. You may be given pain medicine.  Your IV tube can be taken out after you have recovered and your pain is under  control. This information is not intended to replace advice given to you by your health care provider. Make sure you discuss any questions you have with your health care provider. Document Released: 05/16/2014 Document Revised: 03/26/2016 Document Reviewed: 03/08/2014 Elsevier Interactive Patient Education  2018 [employer] Incision and Drainage of a Pilonidal Cyst, Care After Refer to this sheet in the next few weeks. These instructions provide you with information on caring for yourself after your procedure. Your health care provider may also give you more specific instructions. Your treatment has been planned according to current medical practices, but problems sometimes occur. Call your health care provider if you have any problems or questions after your procedure. What can I expect after the procedure? After your procedure, it is typical to have the following:  Pain near or at the surgical area.  Blood-tinged discharge on your wound packing or your bandage (dressing).  Follow these instructions at home:  Take medicines only as directed by your health care provider.  If you were prescribed an antibiotic medicine, finish it all even if you start to feel better.  To prevent constipation: ? Drink enough fluid to keep your urine clear or pale yellow. ? Include lots of whole grains, fruits, and vegetables in your diet.  Do not do activities that irritate or put pressure on your buttocks for about 2 weeks or as directed by your health care provider. These include bike riding, running, and anything that involves a twisting motion.  Do not sit for long periods of time.  Sleep on your side instead of your back.  Ask your health care provider when you can return to work and resume your usual activities.  Wear loose, cotton underwear.  Keep all follow-up visits as directed by your health care provider. This is important. If you had a surgical cut (incision) and drainage with wound  packing:  Return to your health care provider as instructed to have your packing changed or removed.  Keep the incision area dry until your packing has been removed.  After the packing has been removed, you can start taking showers or baths. ? Clean your buttocks area with soap and water. ? Pat the area dry with a soft, clean towel. If you had a marsupialization procedure:  You can start taking showers or baths the day after surgery.  Let the water from the shower or bath moisten your dressing before you remove it.  After your shower or bath, pat your buttocks area dry with a soft, clean towel and replace your dressing.  Ask your health care provider: ? When you can stop using a dressing. ? When you can start taking showers or baths. If you had a surgical cut (incision) and drainage without packing: Follow instructions from your health care provider about how to take care of your incision. Make sure you:  Wash your hands with soap and water before you change your bandage (dressing). If soap and water are not available, use hand sanitizer.  Change your dressing as told by your health care provider.  Leave stitches (sutures), skin glue, or adhesive strips in place. These skin closures may need to stay in place for 2 weeks or longer. If adhesive strip edges start to loosen and curl up, you may trim the loose edges. Do not remove adhesive strips completely unless your health care provider tells you to do that.  Contact a health care provider if:  Your incision is bleeding.  You have signs of infection at your incision or around the incision. Watch for: ? Drainage. ? Redness. ? Swelling. ? Pain.  There is a bad smell coming from your incision site.  Your pain medicine is not helping.  You have a fever or chills.  You have muscles aches.  You are dizzy.  You feel generally ill. This information is not intended to replace advice given to you by your health care provider.  Make sure you discuss any questions you have with your health care provider. Document Released: 11/19/2006 Document Revised: 03/26/2016 Document Reviewed: 03/08/2014 Elsevier Interactive Patient Education  2018 [employer].  General Anesthesia, Adult General anesthesia is the use of medicines to make a person "go to sleep" (be unconscious) for a medical procedure. General anesthesia is often recommended when a procedure:  Is long.  Requires you to be still or  in an unusual position.  Is major and can cause you to lose blood.  Is impossible to do without general anesthesia.  The medicines used for general anesthesia are called general anesthetics. In addition to making you sleep, the medicines:  Prevent pain.  Control your blood pressure.  Relax your muscles.  Tell a health care provider about:  Any allergies you have.  All medicines you are taking, including vitamins, herbs, eye drops, creams, and over-the-counter medicines.  Any problems you or family members have had with anesthetic medicines.  Types of anesthetics you have had in the past.  Any bleeding disorders you have.  Any surgeries you have had.  Any medical conditions you have.  Any history of heart or lung conditions, such as heart failure, sleep apnea, or chronic obstructive pulmonary disease (COPD).  Whether you are pregnant or may be pregnant.  Whether you use tobacco, alcohol, marijuana, or street drugs.  Any history of Financial [employer].  Any history of depression or anxiety. What are the risks? Generally, this is a safe procedure. However, problems may occur, including:  Allergic reaction to anesthetics.  Lung and heart problems.  Inhaling food or liquids from your stomach into your lungs (aspiration).  Injury to nerves.  Waking up during your procedure and being unable to move (rare).  Extreme agitation or a state of mental confusion (delirium) when you wake up from the  anesthetic.  Air in the bloodstream, which can lead to stroke.  These problems are more likely to develop if you are having a major surgery or if you have an advanced medical condition. You can prevent some of these complications by answering all of your health care provider's questions thoroughly and by following all pre-procedure instructions. General anesthesia can cause side effects, including:  Nausea or vomiting  A sore throat from the breathing tube.  Feeling cold or shivery.  Feeling tired, washed out, or achy.  Sleepiness or drowsiness.  Confusion or agitation.  What happens before the procedure? Staying hydrated Follow instructions from your health care provider about hydration, which may include:  Up to 2 hours before the procedure - you may continue to drink clear liquids, such as water, clear fruit juice, black coffee, and plain tea.  Eating and drinking restrictions Follow instructions from your health care provider about eating and drinking, which may include:  8 hours before the procedure - stop eating heavy meals or foods such as meat, fried foods, or fatty foods.  6 hours before the procedure - stop eating light meals or foods, such as toast or cereal.  6 hours before the procedure - stop drinking milk or drinks that contain milk.  2 hours before the procedure - stop drinking clear liquids.  Medicines  Ask your health care provider about: ? Changing or stopping your regular medicines. This is especially important if you are taking diabetes medicines or blood thinners. ? Taking medicines such as aspirin and ibuprofen. These medicines can thin your blood. Do not take these medicines before your procedure if your health care provider instructs you not to. ? Taking new dietary supplements or medicines. Do not take these during the week before your procedure unless your health care provider approves them.  If you are told to take a medicine or to continue  taking a medicine on the day of the procedure, take the medicine with sips of water. General instructions   Ask if you will be going home the same day, the following day, or  after a longer hospital stay. ? Plan to have someone take you home. ? Plan to have someone stay with you for the first 24 hours after you leave the hospital or clinic.  For 3-6 weeks before the procedure, try not to use any tobacco products, such as cigarettes, chewing tobacco, and e-cigarettes.  You may brush your teeth on the morning of the procedure, but make sure to spit out the toothpaste. What happens during the procedure?  You will be given anesthetics through a mask and through an IV tube in one of your veins.  You may receive medicine to help you relax (sedative).  As soon as you are asleep, a breathing tube may be used to help you breathe.  An anesthesia specialist will stay with you throughout the procedure. He or she will help keep you comfortable and safe by continuing to give you medicines and adjusting the amount of medicine that you get. He or she will also watch your blood pressure, pulse, and oxygen levels to make sure that the anesthetics do not cause any problems.  If a breathing tube was used to help you breathe, it will be removed before you wake up. The procedure may vary among health care providers and hospitals. What happens after the procedure?  You will wake up, often slowly, after the procedure is complete, usually in a recovery area.  Your blood pressure, heart rate, breathing rate, and blood oxygen level will be monitored until the medicines you were given have worn off.  You may be given medicine to help you calm down if you feel anxious or agitated.  If you will be going home the same day, your health care provider may check to make sure you can stand, drink, and urinate.  Your health care providers will treat your pain and side effects before you go home.  Do not drive for 24  hours if you received a sedative.  You may: ? Feel nauseous and vomit. ? Have a sore throat. ? Have mental slowness. ? Feel cold or shivery. ? Feel sleepy. ? Feel tired. ? Feel sore or achy, even in parts of your body where you did not have surgery. This information is not intended to replace advice given to you by your health care provider. Make sure you discuss any questions you have with your health care provider. Document Released: 01/26/2008 Document Revised: 03/31/2016 Document Reviewed: 10/03/2015 Elsevier Interactive Patient Education  2018 [employer]. General Anesthesia, Adult, Care After These instructions provide you with information about caring for yourself after your procedure. Your health care provider may also give you more specific instructions. Your treatment has been planned according to current medical practices, but problems sometimes occur. Call your health care provider if you have any problems or questions after your procedure. What can I expect after the procedure? After the procedure, it is common to have:  Vomiting.  A sore throat.  Mental slowness.  It is common to feel:  Nauseous.  Cold or shivery.  Sleepy.  Tired.  Sore or achy, even in parts of your body where you did not have surgery.  Follow these instructions at home: For at least 24 hours after the procedure:  Do not: ? Participate in activities where you could fall or become injured. ? Drive. ? Use heavy machinery. ? Drink alcohol. ? Take sleeping pills or medicines that cause drowsiness. ? Make important decisions or sign legal documents. ? Take care of children on your own.  Rest.  Eating and drinking  If you vomit, drink water, juice, or soup when you can drink without vomiting.  Drink enough fluid to keep your urine clear or pale yellow.  Make sure you have little or no nausea before eating solid foods.  Follow the diet recommended by your health care  provider. General instructions  Have a responsible adult stay with you until you are awake and alert.  Return to your normal activities as told by your health care provider. Ask your health care provider what activities are safe for you.  Take over-the-counter and prescription medicines only as told by your health care provider.  If you smoke, do not smoke without supervision.  Keep all follow-up visits as told by your health care provider. This is important. Contact a health care provider if:  You continue to have nausea or vomiting at home, and medicines are not helpful.  You cannot drink fluids or start eating again.  You cannot urinate after 8-12 hours.  You develop a skin rash.  You have fever.  You have increasing redness at the site of your procedure. Get help right away if:  You have difficulty breathing.  You have chest pain.  You have unexpected bleeding.  You feel that you are having a life-threatening or urgent problem. This information is not intended to replace advice given to you by your health care provider. Make sure you discuss any questions you have with your health care provider. Document Released: 01/25/2001 Document Revised: 03/23/2016 Document Reviewed: 10/03/2015 Elsevier Interactive Patient Education  [employer].

## 2017-07-27 ENCOUNTER — Encounter (HOSPITAL_COMMUNITY): Payer: Self-pay

## 2017-07-27 ENCOUNTER — Encounter (HOSPITAL_COMMUNITY)
Admission: RE | Admit: 2017-07-27 | Discharge: 2017-07-27 | Disposition: A | Discharge status: Home / Self Care | Payer: 59 | Source: Ambulatory Visit | Attending: General Surgery | Admitting: General Surgery

## 2017-07-27 DIAGNOSIS — F17210 Nicotine dependence, cigarettes, uncomplicated: Secondary | ICD-10-CM | POA: Diagnosis not present

## 2017-07-27 DIAGNOSIS — L0591 Pilonidal cyst without abscess: Secondary | ICD-10-CM | POA: Diagnosis not present

## 2017-07-27 LAB — CBC WITH DIFFERENTIAL/PLATELET
BASOS ABS: 0 10*3/uL (ref 0.0–0.1)
Basophils Relative: 0 %
EOS PCT: 4 %
Eosinophils Absolute: 0.3 10*3/uL (ref 0.0–0.7)
HEMATOCRIT: 42.6 % (ref 39.0–52.0)
HEMOGLOBIN: 14.6 g/dL (ref 13.0–17.0)
LYMPHS ABS: 3.9 10*3/uL (ref 0.7–4.0)
Lymphocytes Relative: 43 %
MCH: 30.5 pg (ref 26.0–34.0)
MCHC: 34.3 g/dL (ref 30.0–36.0)
MCV: 89.1 fL (ref 78.0–100.0)
Monocytes Absolute: 1.2 10*3/uL — ABNORMAL HIGH (ref 0.1–1.0)
Monocytes Relative: 13 %
NEUTROS ABS: 3.6 10*3/uL (ref 1.7–7.7)
Neutrophils Relative %: 40 %
Platelets: 197 10*3/uL (ref 150–400)
RBC: 4.78 MIL/uL (ref 4.22–5.81)
RDW: 14.2 % (ref 11.5–15.5)
WBC: 9.1 10*3/uL (ref 4.0–10.5)

## 2017-07-28 ENCOUNTER — Ambulatory Visit (HOSPITAL_COMMUNITY): Payer: 59 | Admitting: Anesthesiology

## 2017-07-28 ENCOUNTER — Ambulatory Visit (HOSPITAL_COMMUNITY)
Admission: RE | Admit: 2017-07-28 | Discharge: 2017-07-28 | Disposition: A | Discharge status: Home / Self Care | Payer: 59 | Source: Ambulatory Visit | Attending: General Surgery | Admitting: General Surgery

## 2017-07-28 ENCOUNTER — Encounter (HOSPITAL_COMMUNITY)
Admission: RE | Disposition: A | Discharge status: Home / Self Care | Payer: Self-pay | Source: Ambulatory Visit | Attending: General Surgery

## 2017-07-28 ENCOUNTER — Encounter (HOSPITAL_COMMUNITY): Payer: Self-pay | Admitting: *Deleted

## 2017-07-28 DIAGNOSIS — L0591 Pilonidal cyst without abscess: Secondary | ICD-10-CM | POA: Insufficient documentation

## 2017-07-28 DIAGNOSIS — F17210 Nicotine dependence, cigarettes, uncomplicated: Secondary | ICD-10-CM | POA: Insufficient documentation

## 2017-07-28 HISTORY — PX: PILONIDAL CYST EXCISION: SHX744

## 2017-07-28 SURGERY — EXCISION, SIMPLE PILONIDAL CYST
Anesthesia: General

## 2017-07-28 MED ORDER — CEFAZOLIN SODIUM-DEXTROSE 2-4 GM/100ML-% IV SOLN
2.0000 g | INTRAVENOUS | Status: AC
  Administered 2017-07-28: 2 g via INTRAVENOUS
  Filled 2017-07-28: qty 100

## 2017-07-28 MED ORDER — MIDAZOLAM HCL 2 MG/2ML IJ SOLN
1.0000 mg | INTRAMUSCULAR | Status: AC
  Administered 2017-07-28 (×2): 2 mg via INTRAVENOUS
  Filled 2017-07-28 (×2): qty 2

## 2017-07-28 MED ORDER — KETOROLAC TROMETHAMINE 30 MG/ML IJ SOLN
30.0000 mg | Freq: Once | INTRAMUSCULAR | Status: AC
  Administered 2017-07-28: 30 mg via INTRAVENOUS
  Filled 2017-07-28: qty 1

## 2017-07-28 MED ORDER — POVIDONE-IODINE 10 % OINT PACKET
TOPICAL_OINTMENT | CUTANEOUS | Status: DC | PRN
  Administered 2017-07-28: 1 via TOPICAL

## 2017-07-28 MED ORDER — 0.9 % SODIUM CHLORIDE (POUR BTL) OPTIME
TOPICAL | Status: DC | PRN
  Administered 2017-07-28: 1000 mL

## 2017-07-28 MED ORDER — LIDOCAINE HCL (PF) 1 % IJ SOLN
INTRAMUSCULAR | Status: AC
  Filled 2017-07-28: qty 5

## 2017-07-28 MED ORDER — LACTATED RINGERS IV SOLN
INTRAVENOUS | Status: DC
  Administered 2017-07-28: 10:00:00 via INTRAVENOUS

## 2017-07-28 MED ORDER — FENTANYL CITRATE (PF) 250 MCG/5ML IJ SOLN
INTRAMUSCULAR | Status: AC
  Filled 2017-07-28: qty 5

## 2017-07-28 MED ORDER — LIDOCAINE HCL (CARDIAC) 10 MG/ML IV SOLN
INTRAVENOUS | Status: DC | PRN
  Administered 2017-07-28: 50 mg via INTRAVENOUS

## 2017-07-28 MED ORDER — BUPIVACAINE HCL (PF) 0.5 % IJ SOLN
INTRAMUSCULAR | Status: AC
  Filled 2017-07-28: qty 30

## 2017-07-28 MED ORDER — EPHEDRINE SULFATE 50 MG/ML IJ SOLN
INTRAMUSCULAR | Status: DC | PRN
  Administered 2017-07-28: 5 mg via INTRAVENOUS

## 2017-07-28 MED ORDER — CHLORHEXIDINE GLUCONATE CLOTH 2 % EX PADS: 6.0000 | MEDICATED_PAD | Freq: Once | CUTANEOUS | Status: DC

## 2017-07-28 MED ORDER — ONDANSETRON HCL 4 MG/2ML IJ SOLN
4.0000 mg | Freq: Once | INTRAMUSCULAR | Status: AC
  Administered 2017-07-28: 4 mg via INTRAVENOUS
  Filled 2017-07-28: qty 2

## 2017-07-28 MED ORDER — GLYCOPYRROLATE 0.2 MG/ML IJ SOLN
0.2000 mg | Freq: Once | INTRAMUSCULAR | Status: AC
  Administered 2017-07-28: 0.2 mg via INTRAVENOUS
  Filled 2017-07-28: qty 1

## 2017-07-28 MED ORDER — PROPOFOL 10 MG/ML IV BOLUS
INTRAVENOUS | Status: DC | PRN
Start: 2017-07-28 — End: 2017-07-28
  Administered 2017-07-28: 200 mg via INTRAVENOUS
  Administered 2017-07-28: 100 mg via INTRAVENOUS

## 2017-07-28 MED ORDER — CHLORHEXIDINE GLUCONATE CLOTH 2 % EX PADS
6.0000 | MEDICATED_PAD | Freq: Once | CUTANEOUS | Status: DC
Start: 2017-07-28 — End: 2017-07-28

## 2017-07-28 MED ORDER — FENTANYL CITRATE (PF) 100 MCG/2ML IJ SOLN
INTRAMUSCULAR | Status: DC | PRN
  Administered 2017-07-28: 50 ug via INTRAVENOUS
  Administered 2017-07-28: 25 ug via INTRAVENOUS
  Administered 2017-07-28 (×2): 50 ug via INTRAVENOUS
  Administered 2017-07-28: 25 ug via INTRAVENOUS

## 2017-07-28 MED ORDER — HYDROCODONE-ACETAMINOPHEN 5-325 MG PO TABS: 1.0000 | ORAL_TABLET | ORAL | 0 refills | Status: DC | PRN

## 2017-07-28 MED ORDER — BUPIVACAINE HCL 0.5 % IJ SOLN
INTRAMUSCULAR | Status: DC | PRN
  Administered 2017-07-28: 10 mL

## 2017-07-28 MED ORDER — FENTANYL CITRATE (PF) 100 MCG/2ML IJ SOLN: 25.0000 ug | INTRAMUSCULAR | Status: DC | PRN

## 2017-07-28 MED ORDER — POVIDONE-IODINE 10 % EX OINT
TOPICAL_OINTMENT | CUTANEOUS | Status: AC
  Filled 2017-07-28: qty 1

## 2017-07-28 MED ORDER — PROPOFOL 10 MG/ML IV BOLUS
INTRAVENOUS | Status: AC
  Filled 2017-07-28: qty 40

## 2017-07-28 SURGICAL SUPPLY — 32 items
BAG HAMPER (MISCELLANEOUS) ×3 IMPLANT
CLOTH BEACON ORANGE TIMEOUT ST (SAFETY) ×3 IMPLANT
COVER LIGHT HANDLE STERIS (MISCELLANEOUS) ×6 IMPLANT
DECANTER SPIKE VIAL GLASS SM (MISCELLANEOUS) ×3 IMPLANT
ELECT REM PT RETURN 9FT ADLT (ELECTROSURGICAL) ×3
ELECTRODE REM PT RTRN 9FT ADLT (ELECTROSURGICAL) ×1 IMPLANT
FORMALIN 10 PREFIL 120ML (MISCELLANEOUS) ×3 IMPLANT
GAUZE SPONGE 4X4 12PLY STRL (GAUZE/BANDAGES/DRESSINGS) ×6 IMPLANT
GLOVE BIOGEL PI IND STRL 7.0 (GLOVE) ×1 IMPLANT
GLOVE BIOGEL PI INDICATOR 7.0 (GLOVE) ×2
GLOVE SURG SS PI 7.5 STRL IVOR (GLOVE) ×6 IMPLANT
GOWN STRL REUS W/TWL LRG LVL3 (GOWN DISPOSABLE) ×6 IMPLANT
KIT ROOM TURNOVER APOR (KITS) ×3 IMPLANT
MANIFOLD NEPTUNE II (INSTRUMENTS) ×3 IMPLANT
NDL HYPO 25X1 1.5 SAFETY (NEEDLE) ×1 IMPLANT
NEEDLE HYPO 25X1 1.5 SAFETY (NEEDLE) ×3 IMPLANT
NS IRRIG 1000ML POUR BTL (IV SOLUTION) ×3 IMPLANT
PACK MINOR (CUSTOM PROCEDURE TRAY) ×3 IMPLANT
PAD ABD 5X9 TENDERSORB (GAUZE/BANDAGES/DRESSINGS) ×3 IMPLANT
PAD ARMBOARD 7.5X6 YLW CONV (MISCELLANEOUS) ×3 IMPLANT
SET BASIN LINEN APH (SET/KITS/TRAYS/PACK) ×3 IMPLANT
SOL PREP PROV IODINE SCRUB 4OZ (MISCELLANEOUS) ×3 IMPLANT
SPONGE GAUZE 2X2 8PLY STER LF (GAUZE/BANDAGES/DRESSINGS) ×1
SPONGE GAUZE 2X2 8PLY STRL LF (GAUZE/BANDAGES/DRESSINGS) ×2 IMPLANT
SPONGE GAUZE 4X4 12PLY (GAUZE/BANDAGES/DRESSINGS) ×2 IMPLANT
SPONGE LAP 18X18 X RAY DECT (DISPOSABLE) ×3 IMPLANT
SUT PROLENE 2 0 FS (SUTURE) ×4 IMPLANT
SUT VIC AB 3-0 SH 27 (SUTURE) ×3
SUT VIC AB 3-0 SH 27X BRD (SUTURE) IMPLANT
SYR CONTROL 10ML LL (SYRINGE) ×3 IMPLANT
TAPE CLOTH SURG 4X10 WHT LF (GAUZE/BANDAGES/DRESSINGS) ×2 IMPLANT
TOWEL OR 17X26 4PK STRL BLUE (TOWEL DISPOSABLE) ×3 IMPLANT

## 2017-07-28 NOTE — Anesthesia Procedure Notes (Signed)
Procedure Name: LMA Insertion Date/Time: 07/28/2017 11:36 AM Performed by: Glynn Octave E Pre-anesthesia Checklist: Patient identified, Patient being monitored, Emergency Drugs available, Timeout performed and Suction available Patient Re-evaluated:Patient Re-evaluated prior to induction Oxygen Delivery Method: Circle System Utilized Preoxygenation: Pre-oxygenation with 100% oxygen Induction Type: IV induction Ventilation: Mask ventilation without difficulty LMA: LMA inserted LMA Size: 5.0 Number of attempts: 1 Placement Confirmation: positive ETCO2 and breath sounds checked- equal and bilateral

## 2017-07-28 NOTE — Anesthesia Postprocedure Evaluation (Signed)
Anesthesia Post Note  Patient: Chad Blankenship  Procedure(s) Performed: Procedure(s) (LRB): CYST EXCISION PILONIDAL SIMPLE (N/A)  Patient location during evaluation: Short Stay Anesthesia Type: General Level of consciousness: awake and alert and oriented Pain management: pain level controlled Vital Signs Assessment: post-procedure vital signs reviewed and stable Respiratory status: spontaneous breathing Cardiovascular status: blood pressure returned to baseline and stable Postop Assessment: no apparent nausea or vomiting Anesthetic complications: no     Last Vitals:  Vitals:   07/28/17 1330 07/28/17 1337  BP: (!) 155/99 (!) 150/86  Pulse: (!) 44 (!) 47  Resp: 13   Temp:  (!) 35.7 C  SpO2: 100% 98%    Last Pain:  Vitals:   07/28/17 1337  TempSrc:   PainSc: 0-No pain                 Cashton Hosley

## 2017-07-28 NOTE — Transfer of Care (Signed)
Immediate Anesthesia Transfer of Care Note  Patient: Chad Blankenship  Procedure(s) Performed: Procedure(s): CYST EXCISION PILONIDAL SIMPLE (N/A)  Patient Location: PACU  Anesthesia Type:General  Level of Consciousness: awake and alert   Airway & Oxygen Therapy: Patient Spontanous Breathing and Patient connected to face mask oxygen  Post-op Assessment: Report given to RN  Post vital signs: Reviewed and stable  Last Vitals:  Vitals:   07/28/17 1050 07/28/17 1100  BP: 127/90 139/86  Resp: (!) 39 (!) 0  Temp:    SpO2: 100% 100%    Last Pain:  Vitals:   07/28/17 0949  TempSrc: Oral      Patients Stated Pain Goal: 8 (07/28/17 0949)  Complications: No apparent anesthesia complications

## 2017-07-28 NOTE — Op Note (Signed)
Patient:  Chad Blankenship  DOB:  [birth year]  MRN:  [id number]   Preop Diagnosis:  Pilonidal cyst  Postop Diagnosis:  Same  Procedure:  Excision of pilonidal cyst  Surgeon:  Franky Macho, M.D.  Anes:  Gen.  Indications:  Patient is a 36 year old black male who presents with recurrent episodes of infected pilonidal cyst. It is currently not infected. The risks and benefits of the procedure including bleeding, infection, and recurrence of the pilonidal cyst were fully explained to the patient, who gave informed consent.  Procedure note:  The patient was placed in the right lateral decubitus position after general anesthesia was administered. The pilonidal region was prepped and draped using usual sterile technique with DuraPrep. Surgical site confirmation was performed.  On examination, the patient had scarring from previous infection over the pilonidal area. This measured approximately 3-4 cm in its greatest length. An elliptical incision was made around this region and was taken down to the subcutaneous tissue. Small amounts of granulomatous tissue were found and excised in total without difficulty. The specimen was sent to pathology for further examination. A bleeding was controlled using Bovie electrocautery. The wound was injected with 0.5% Sensorcaine. The subcutaneous layer was reapproximated using 3-0 Vicryl interrupted suture. The skin was closed using 2-0 Prolene interrupted sutures. Betadine ointment and a dry sterile dressing were applied.  All tape and needle counts were correct at the end the procedure. The patient was awakened and transferred to PACU in stable condition.  Complications:  None  EBL:  Minimal  Specimen:  Pilonidal cyst

## 2017-07-28 NOTE — Anesthesia Preprocedure Evaluation (Signed)
Anesthesia Evaluation   Patient awake    Reviewed: Allergy & Precautions, NPO status , Patient's Chart, lab work & pertinent test results  Airway Mallampati: II  TM Distance: >3 FB Neck ROM: Full    Dental  (+) Teeth Intact   Pulmonary Current Smoker,    breath sounds clear to auscultation       Cardiovascular negative cardio ROS   Rhythm:Regular Rate:Normal     Neuro/Psych negative neurological ROS  negative psych ROS   GI/Hepatic negative GI ROS, Neg liver ROS,   Endo/Other    Renal/GU negative Renal ROS     Musculoskeletal   Abdominal   Peds  Hematology   Anesthesia Other Findings   Reproductive/Obstetrics                            Anesthesia Physical Anesthesia Plan  ASA: I  Anesthesia Plan: General   Post-op Pain Management:    Induction: Intravenous  PONV Risk Score and Plan:   Airway Management Planned: LMA  Additional Equipment:   Intra-op Plan:   Post-operative Plan: Extubation in OR  Informed Consent: I have reviewed the patients History and Physical, chart, labs and discussed the procedure including the risks, benefits and alternatives for the proposed anesthesia with the patient or authorized representative who has indicated his/her understanding and acceptance.     Plan Discussed with:   Anesthesia Plan Comments:         Anesthesia Quick Evaluation

## 2017-07-28 NOTE — Discharge Instructions (Signed)
Pilonidal Cyst A pilonidal cyst is a fluid-filled sac. It forms beneath the skin near your tailbone, at the top of the crease of your buttocks. A pilonidal cyst that is not large or infected may not cause symptoms or problems. If the cyst becomes irritated or infected, it may fill with pus. This causes pain and swelling (pilonidal abscess). An infected cyst may need to be treated with medicine, drained, or removed. What are the causes? The cause of a pilonidal cyst is not known. One cause may be a hair that grows into your skin (ingrown hair). What increases the risk? Pilonidal cysts are more common in boys and men. Risk factors include:  Having lots of hair near the crease of the buttocks.  Being overweight.  Having a pilonidal dimple.  Wearing tight clothing.  Not bathing or showering frequently.  Sitting for long periods of time.  What are the signs or symptoms? Signs and symptoms of a pilonidal cyst may include:  Redness.  Pain and tenderness.  Warmth.  Swelling.  Pus.  Fever.  How is this diagnosed? Your health care provider may diagnose a pilonidal cyst based on your symptoms and a physical exam. The health care provider may do a blood test to check for infection. If your cyst is draining pus, your health care provider may take a sample of the drainage to be tested at a laboratory. How is this treated? Surgery is the usual treatment for an infected pilonidal cyst. You may also have to take medicines before surgery. The type of surgery you have depends on the size and severity of the infected cyst. The different kinds of surgery include:  Incision and drainage. This is a procedure to open and drain the cyst.  Marsupialization. In this procedure, a large cyst or abscess may be opened and kept open by stitching the edges of the skin to the cyst walls.  Cyst removal. This procedure involves opening the skin and removing all or part of the cyst.  Follow these  instructions at home:  Follow all of your surgeon's instructions carefully if you had surgery.  Take medicines only as directed by your health care provider.  If you were prescribed an antibiotic medicine, finish it all even if you start to feel better.  Keep the area around your pilonidal cyst clean and dry.  Clean the area as directed by your health care provider. Pat the area dry with a clean towel. Do not rub it as this may cause bleeding.  Remove hair from the area around the cyst as directed by your health care provider.  Do not wear tight clothing or sit in one place for long periods of time.  There are many different ways to close and cover an incision, including stitches, skin glue, and adhesive strips. Follow your health care provider's instructions on: ? Incision care. ? Bandage (dressing) changes and removal. ? Incision closure removal. Contact a health care provider if:  You have drainage, redness, swelling, or pain at the site of the cyst.  You have a fever. This information is not intended to replace advice given to you by your health care provider. Make sure you discuss any questions you have with your health care provider. Document Released: 10/16/2000 Document Revised: 03/26/2016 Document Reviewed: 03/08/2014 Elsevier Interactive Patient Education  2018 Elsevier Inc.  

## 2017-07-28 NOTE — Interval H&P Note (Signed)
History and Physical Interval Note:  07/28/2017 10:45 AM  Chad Blankenship  has presented today for surgery, with the diagnosis of simple pilonidal cyst  The various methods of treatment have been discussed with the patient and family. After consideration of risks, benefits and other options for treatment, the patient has consented to  Procedure(s): CYST EXCISION PILONIDAL SIMPLE (N/A) as a surgical intervention .  The patient's history has been reviewed, patient examined, no change in status, stable for surgery.  I have reviewed the patient's chart and labs.  Questions were answered to the patient's satisfaction.     Franky Macho

## 2017-07-29 ENCOUNTER — Encounter (HOSPITAL_COMMUNITY): Payer: Self-pay | Admitting: General Surgery

## 2017-08-10 ENCOUNTER — Encounter: Payer: Self-pay | Admitting: General Surgery

## 2017-08-10 ENCOUNTER — Ambulatory Visit (INDEPENDENT_AMBULATORY_CARE_PROVIDER_SITE_OTHER): Payer: Self-pay | Admitting: General Surgery

## 2017-08-10 VITALS — BP 150/86 | HR 59 | Temp 98.7°F | Resp 18 | Ht 75.0 in | Wt 232.0 lb

## 2017-08-10 DIAGNOSIS — Z09 Encounter for follow-up examination after completed treatment for conditions other than malignant neoplasm: Secondary | ICD-10-CM

## 2017-08-10 NOTE — Progress Notes (Signed)
Subjective:     Chad Blankenship  Status post excision of pilonidal cyst. Patient denies any pain. Denies any fevers. Objective:    BP (!) 150/86   Pulse (!) 59   Temp 98.7 F (37.1 C)   Resp 18   Ht  (1.905 m)   Wt 232 lb (105.2 kg)   BMI 29.00 kg/m   General:  alert, cooperative and no distress  Incision has slightly dehisced and pulled through the sutures. All sutures were removed. No purulent drainage noted. No erythema noted.     Assessment:    Superficial wound dehiscence of wound, no infection present.    Plan:   I told patient that this was not uncommon and he should keep the area clean and dried daily. Follow-up here in 2 weeks for wound check.

## 2017-08-24 ENCOUNTER — Encounter: Payer: Self-pay | Admitting: General Surgery

## 2017-08-24 ENCOUNTER — Ambulatory Visit: Payer: 59 | Admitting: General Surgery

## 2017-08-24 ENCOUNTER — Ambulatory Visit (INDEPENDENT_AMBULATORY_CARE_PROVIDER_SITE_OTHER): Payer: Self-pay | Admitting: General Surgery

## 2017-08-24 VITALS — BP 151/82 | HR 73 | Temp 98.7°F | Resp 18 | Ht 75.0 in | Wt 232.0 lb

## 2017-08-24 DIAGNOSIS — Z09 Encounter for follow-up examination after completed treatment for conditions other than malignant neoplasm: Secondary | ICD-10-CM

## 2017-08-24 NOTE — Progress Notes (Signed)
Subjective:     Chad LoboLeonard Blankenship  Status post excision of pilonidal cyst. Incision has been healing by secondary intention. Patient states the incision has fully healed. Objective:    BP (!) 151/82   Pulse 73   Temp 98.7 F (37.1 C)   Resp 18   Ht 6\' 3"  (1.905 m)   Wt 232 lb (105.2 kg)   BMI 29.00 kg/m   General:  alert, cooperative and no distress  Incision well-healed     Assessment:    Doing well postoperatively.    Plan:   May return to work without restrictions on 08/30/2017. Follow-up here as needed.

## 2018-03-14 ENCOUNTER — Other Ambulatory Visit: Payer: Self-pay

## 2018-03-14 ENCOUNTER — Emergency Department (HOSPITAL_COMMUNITY): Payer: Worker's Compensation

## 2018-03-14 ENCOUNTER — Encounter (HOSPITAL_COMMUNITY): Payer: Self-pay

## 2018-03-14 ENCOUNTER — Emergency Department (HOSPITAL_COMMUNITY)
Admission: EM | Admit: 2018-03-14 | Discharge: 2018-03-14 | Disposition: A | Discharge status: Home / Self Care | Payer: Worker's Compensation | Attending: Emergency Medicine | Admitting: Emergency Medicine

## 2018-03-14 DIAGNOSIS — Y929 Unspecified place or not applicable: Secondary | ICD-10-CM | POA: Diagnosis not present

## 2018-03-14 DIAGNOSIS — Z79899 Other long term (current) drug therapy: Secondary | ICD-10-CM | POA: Insufficient documentation

## 2018-03-14 DIAGNOSIS — F17210 Nicotine dependence, cigarettes, uncomplicated: Secondary | ICD-10-CM | POA: Insufficient documentation

## 2018-03-14 DIAGNOSIS — Z23 Encounter for immunization: Secondary | ICD-10-CM | POA: Insufficient documentation

## 2018-03-14 DIAGNOSIS — W230XXA Caught, crushed, jammed, or pinched between moving objects, initial encounter: Secondary | ICD-10-CM | POA: Diagnosis not present

## 2018-03-14 DIAGNOSIS — Y939 Activity, unspecified: Secondary | ICD-10-CM | POA: Diagnosis not present

## 2018-03-14 DIAGNOSIS — S61012A Laceration without foreign body of left thumb without damage to nail, initial encounter: Secondary | ICD-10-CM | POA: Insufficient documentation

## 2018-03-14 DIAGNOSIS — Y999 Unspecified external cause status: Secondary | ICD-10-CM | POA: Diagnosis not present

## 2018-03-14 DIAGNOSIS — S6992XA Unspecified injury of left wrist, hand and finger(s), initial encounter: Secondary | ICD-10-CM

## 2018-03-14 MED ORDER — CEPHALEXIN 500 MG PO CAPS: 500.0000 mg | ORAL_CAPSULE | Freq: Four times a day (QID) | ORAL | 0 refills | Status: DC

## 2018-03-14 MED ORDER — TETANUS-DIPHTH-ACELL PERTUSSIS 5-2.5-18.5 LF-MCG/0.5 IM SUSP
0.5000 mL | Freq: Once | INTRAMUSCULAR | Status: AC
  Administered 2018-03-14: 0.5 mL via INTRAMUSCULAR
  Filled 2018-03-14: qty 0.5

## 2018-03-14 MED ORDER — LIDOCAINE HCL (PF) 1 % IJ SOLN
10.0000 mL | Freq: Once | INTRAMUSCULAR | Status: AC
  Administered 2018-03-14: 10 mL
  Filled 2018-03-14: qty 10

## 2018-03-14 NOTE — ED Notes (Signed)
Pt stated that his care team was "awesome" and that they treated him well. Pt stated that they were "very open ears with his pain and really cared about his pain and was not just here to get that job done".

## 2018-03-14 NOTE — ED Provider Notes (Signed)
[facility] EMERGENCY DEPARTMENT Provider Note   CSN: [id number] Arrival date & time: 03/14/18  8295     History   Chief Complaint Chief Complaint  Patient presents with  . Hand Injury    HPI Chad Blankenship is a 37 y.o. male.  Chad Blankenship is a 37 y.o. Male who is otherwise healthy, presents to the emergency department for evaluation of injury to the left thumb.  Patient reports a trailer hitch came down on his thumb causing a laceration.  Patient reports this happened earlier this morning just prior to arrival, bleeding is controlled.  Patient is able to move the finger without difficulty, and denies any numbness or tingling.  Laceration goes right along the edge of the nail but there does not appear to be any damage to the nail.  Patient reports his last tetanus shot was maybe 9 years ago.  He has not taken anything prior to arrival for pain.  No pain in the proximal thumb or hand.     History reviewed. No pertinent past medical history.  Patient Active Problem List   Diagnosis Date Noted  . Pilonidal cyst     Past Surgical History:  Procedure Laterality Date  . EXTERNAL EAR SURGERY    . INCISE AND DRAIN ABCESS    . PILONIDAL CYST EXCISION N/A 07/28/2017   Procedure: CYST EXCISION PILONIDAL SIMPLE;  Surgeon: Franky Macho, MD;  Location: [facility];  Service: General;  Laterality: N/A;        Home Medications    Prior to Admission medications   Medication Sig Start Date End Date Taking? Authorizing Provider  HYDROcodone-acetaminophen (NORCO) 5-325 MG tablet Take 1-2 tablets by mouth every 4 (four) hours as needed for moderate pain. 07/28/17   Franky Macho, MD  Multiple Vitamin (MULTIVITAMIN WITH MINERALS) TABS tablet Take 1 tablet by mouth daily.    [provider]    Family History History reviewed. No pertinent family history.  Social History Social History   Tobacco Use  . Smoking status: Current Every Day Smoker   Packs/day: 0.50    Years: 12.00    Pack years: 6.00    Types: Cigarettes  . Smokeless tobacco: Never Used  Substance Use Topics  . Alcohol use: No  . Drug use: No     Allergies   Patient has no known allergies.   Review of Systems Review of Systems  Constitutional: Negative for chills and fever.  Musculoskeletal: Negative for arthralgias and myalgias.  Skin: Positive for wound.  Neurological: Negative for weakness and numbness.     Physical Exam Updated Vital Signs BP (!) 149/102 (BP Location: Right Arm)   Pulse 80   Temp 99.1 F (37.3 C) (Oral)   Resp 20   SpO2 99%   Physical Exam  Constitutional: He appears well-developed and well-nourished. No distress.  HENT:  Head: Normocephalic and atraumatic.  Eyes: Right eye exhibits no discharge. Left eye exhibits no discharge.  Pulmonary/Chest: Effort normal. No respiratory distress.  Musculoskeletal:  Laceration to the distal portion of the left thumb as shown the pictures below, some goes across the tip of the nail  Neurological: He is alert. Coordination normal.  Skin: He is not diaphoretic.  Psychiatric: He has a normal mood and affect. His behavior is normal.  Nursing note and vitals reviewed.        ED Treatments / Results  Labs (all labs ordered are listed, but only abnormal results are displayed) Labs Reviewed - No  data to display  EKG None  Radiology Dg Finger Thumb Left  Result Date: 03/14/2018 CLINICAL DATA:  Crush injury of the thumb at work this morning with laceration over the distal thumb. EXAM: LEFT THUMB 2+V COMPARISON:  None. FINDINGS: The bones are subjectively adequately mineralized. There is no acute fracture or dislocation. There is mild soft tissue disruption over the pad of the finger. The joint spaces are well maintained. IMPRESSION: There is no acute fracture or dislocation of the left thumb. There is mild soft tissue swelling and cutaneous irregularity over the pad of the thumb  consistent with known laceration. Electronically Signed   By: David  [place] M.D.   On: 03/14/2018 09:28    Procedures .Marland KitchenLaceration Repair Date/Time: 03/14/2018 12:57 PM Performed by: Dartha Lodge, PA-C Authorized by: Dartha Lodge, PA-C   Consent:    Consent obtained:  Verbal   Consent given by:  Patient   Risks discussed:  Infection, pain, poor cosmetic result, poor wound healing and need for additional repair   Alternatives discussed:  No treatment Anesthesia (see MAR for exact dosages):    Anesthesia method:  Nerve block   Block location:  Digital, left thumb   Block needle gauge:  25 G   Block anesthetic:  Lidocaine 1% w/o epi   Block injection procedure:  Anatomic landmarks identified and incremental injection   Block outcome:  Anesthesia achieved Laceration details:    Location:  Finger   Finger location:  L thumb   Length (cm):  3   Depth (mm):  5 Repair type:    Repair type:  Intermediate Pre-procedure details:    Preparation:  Patient was prepped and draped in usual sterile fashion and imaging obtained to evaluate for foreign bodies Exploration:    Hemostasis achieved with:  Tourniquet and direct pressure   Wound exploration: wound explored through full range of motion and entire depth of wound probed and visualized     Wound extent: areolar tissue violated     Wound extent: no nerve damage noted, no tendon damage noted and no underlying fracture noted   Treatment:    Area cleansed with:  Shur-Clens and saline   Amount of cleaning:  Extensive   Irrigation solution:  Sterile saline   Irrigation volume:  1 L   Irrigation method:  Pressure wash Skin repair:    Repair method:  Sutures   Suture size:  4-0   Suture material:  Prolene (Vicryl 4-0)   Suture technique:  Simple interrupted   Number of sutures:  9 (2 absorbable sutures placed at the tip of the nailbed, with 4 extending around the medial aspect of the thumb and 3 sutures placed over the smaller laceration  of the finger pad) Approximation:    Approximation:  Loose Post-procedure details:    Dressing:  Antibiotic ointment and bulky dressing   Patient tolerance of procedure:  Tolerated well, no immediate complications Comments:     Patient with laceration to the left thumb from a crush injury, there is a significant amount of edema inhibiting close approximation, laceration involves the edge of the nailbed but there is no damage to the nail, the tip of the nail was removed, laceration at the tip of the nailbed was secured with absorbable sutures, and the rest was approximated loosely with Prolene, small laceration over the middle portion of the finger pad repaired with simple interrupted sutures as well.  Patient tolerated the procedure well, bulky dressing applied.   (including critical  care time)  Medications Ordered in ED Medications  Tdap (BOOSTRIX) injection 0.5 mL (0.5 mLs Intramuscular Given 03/14/18 1102)  lidocaine (PF) (XYLOCAINE) 1 % injection 10 mL (10 mLs Infiltration Given 03/14/18 1102)     Initial Impression / Assessment and Plan / ED Course  I have reviewed the triage vital signs and the nursing notes.  Pertinent labs & imaging results that were available during my care of the patient were reviewed by me and considered in my medical decision making (see chart for details).  Patient presents for complex laceration due to crush injury of the distal portion of the left thumb with laceration across the top edge of the nail and extending down over the medial portion of the thumb, as well as a smaller laceration across the middle of the finger pad.  Pressure irrigation performed. Wound explored and base of wound visualized in a bloodless field without evidence of foreign body or underlying fracture.  Due to edema wound could not be completely well approximated, tip of the nail was removed to aid in repair at the edge of the nailbed.  Tdap updated.  Patient discharged with Keflex for  infection prevention, encouraged to use ice and elevation to help with edema counseled on appropriate wound care.  Discussed suture home care with patient and answered questions. Pt to follow-up for wound check in the next 2 to 3 days, and will need his sutures removed in about 10 days; they are to return to the ED sooner for signs of infection. Pt is hemodynamically stable with no complaints prior to dc.    Final Clinical Impressions(s) / ED Diagnoses   Final diagnoses:  Laceration of left thumb without foreign body, nail damage status unspecified, initial encounter  Injury of finger of left hand, initial encounter    ED Discharge Orders        Ordered    cephALEXin (KEFLEX) 500 MG capsule  4 times daily     03/14/18 1227       Dartha Lodge, [place] 03/14/18 1317    Gerhard Munch, MD 03/14/18 1536

## 2018-03-14 NOTE — ED Triage Notes (Signed)
Pt states a trailer hitch came down on his thumb. He has laceration to left thumb. Bleeding controlled.

## 2018-03-14 NOTE — Discharge Instructions (Addendum)
Please take entire course of antibiotics as prescribed to help prevent infection.  Ice and elevate the hand to help reduce swelling you may use ibuprofen or Tylenol as needed for pain.  You may shower but do not submerge to the hand under water, keep dressing clean and dry and keep the finger covered.  You will need to follow-up in 2 to 3 days for a wound check, sutures will need to be removed in about 10 days.  You can follow-up for wound check in the emergency department, urgent care or with your primary care doctor.  If you notice any redness, swelling, warmth, drainage or have any fevers or chills please return to the emergency department for sooner reevaluation.

## 2018-03-14 NOTE — ED Notes (Signed)
Applied bacitracin, Telfa, and a bulky dressing with the assistance of Chestine Spore, Charity fundraiser, per [employer].

## 2018-07-18 ENCOUNTER — Other Ambulatory Visit: Payer: Self-pay

## 2018-07-18 ENCOUNTER — Emergency Department (HOSPITAL_COMMUNITY)
Admission: EM | Admit: 2018-07-18 | Discharge: 2018-07-18 | Disposition: A | Discharge status: Home / Self Care | Payer: Self-pay | Attending: Emergency Medicine | Admitting: Emergency Medicine

## 2018-07-18 ENCOUNTER — Emergency Department (HOSPITAL_COMMUNITY): Payer: Self-pay

## 2018-07-18 ENCOUNTER — Encounter (HOSPITAL_COMMUNITY): Payer: Self-pay | Admitting: Emergency Medicine

## 2018-07-18 DIAGNOSIS — F17210 Nicotine dependence, cigarettes, uncomplicated: Secondary | ICD-10-CM | POA: Insufficient documentation

## 2018-07-18 DIAGNOSIS — M25562 Pain in left knee: Secondary | ICD-10-CM | POA: Insufficient documentation

## 2018-07-18 MED ORDER — DICLOFENAC SODIUM 75 MG PO TBEC: 75.0000 mg | DELAYED_RELEASE_TABLET | Freq: Two times a day (BID) | ORAL | 0 refills | Status: DC

## 2018-07-18 NOTE — ED Triage Notes (Signed)
Patient complains of left knee pain x 3 weeks.

## 2018-07-18 NOTE — ED Provider Notes (Signed)
[facility] EMERGENCY DEPARTMENT Provider Note   CSN: [id number] Arrival date & time: 07/18/18  1312     History   Chief Complaint No chief complaint on file.   HPI Chad Blankenship is a 37 y.o. male.  HPI   Chad Blankenship is a 37 y.o. male who presents to the Emergency Department complaining of persistent pain of the left knee for 3 weeks.  He states that he was lifting weights sometime prior to onset of pain and believes he may have injured his knee.  He states he also does a lot of walking and bending at his job.  He describes a constant aching pain of his knee, but pain worse today after excessive walking.  He describes the pain as mainly to the front of his knee.  He has not tried any therapies prior to arrival.  He denies previous injuries of the knee.  He denies swelling, redness, numbness or pain distal to the knee.    History reviewed. No pertinent past medical history.  Patient Active Problem List   Diagnosis Date Noted  . Pilonidal cyst     Past Surgical History:  Procedure Laterality Date  . EXTERNAL EAR SURGERY    . INCISE AND DRAIN ABCESS    . PILONIDAL CYST EXCISION N/A 07/28/2017   Procedure: CYST EXCISION PILONIDAL SIMPLE;  Surgeon: Franky MachoJenkins, Mark, MD;  Location: [facility];  Service: General;  Laterality: N/A;     Home Medications    Prior to Admission medications   Medication Sig Start Date End Date Taking? Authorizing Provider  cephALEXin (KEFLEX) 500 MG capsule Take 1 capsule (500 mg total) by mouth 4 (four) times daily. 03/14/18   Dartha LodgeFord, Kelsey N, PA-C  HYDROcodone-acetaminophen (NORCO) 5-325 MG tablet Take 1-2 tablets by mouth every 4 (four) hours as needed for moderate pain. 07/28/17   Franky MachoJenkins, Mark, MD  Multiple Vitamin (MULTIVITAMIN WITH MINERALS) TABS tablet Take 1 tablet by mouth daily.    [provider]    Family History No family history on file.  Social History Social History   Tobacco Use  . Smoking status: Current Every Day  Smoker    Packs/day: 0.50    Years: 12.00    Pack years: 6.00    Types: Cigarettes  . Smokeless tobacco: Never Used  Substance Use Topics  . Alcohol use: No  . Drug use: No     Allergies   Patient has no known allergies.   Review of Systems Review of Systems  Constitutional: Negative for chills and fever.  Musculoskeletal: Positive for arthralgias (left knee pain). Negative for joint swelling.  Skin: Negative for color change and wound.  Neurological: Negative for weakness and numbness.     Physical Exam Updated Vital Signs BP 128/84 (BP Location: Right Arm)   Pulse 87   Temp 98.2 F (36.8 C) (Oral)   Resp 14   Ht 6\' 3"  (1.905 m)   Wt 126.6 kg   SpO2 100%   BMI 34.87 kg/m   Physical Exam  Constitutional: He is oriented to person, place, and time. He appears well-developed and well-nourished. No distress.  Cardiovascular: Normal rate, regular rhythm and intact distal pulses.  Pulmonary/Chest: Effort normal and breath sounds normal.  Musculoskeletal: He exhibits tenderness. He exhibits no edema or deformity.  ttp of the anterior left knee.  No erythema, effusion, edema or step-off deformity.  Neg drawer sign.    Neurological: He is alert and oriented to person, place, and time. He exhibits  normal muscle tone. Coordination normal.  Skin: Skin is warm. Capillary refill takes less than 2 seconds. No erythema.  Psychiatric: He has a normal mood and affect.  Nursing note and vitals reviewed.    ED Treatments / Results  Labs (all labs ordered are listed, but only abnormal results are displayed) Labs Reviewed - No data to display  EKG None  Radiology Dg Knee Complete 4 Views Left  Result Date: 07/18/2018 CLINICAL DATA:  Left knee pain for 3 weeks. EXAM: LEFT KNEE - COMPLETE 4+ VIEW COMPARISON:  None. FINDINGS: Negative for fracture, dislocation or large joint effusion. There is a bipartite patella which is a normal variant. Loose body and enthesopathic changes  along the medial proximal tibia. IMPRESSION: No acute abnormality to the left knee. Electronically Signed   By: Richarda Overlie M.D.   On: 07/18/2018 14:01    Procedures Procedures (including critical care time)  Medications Ordered in ED Medications - No data to display   Initial Impression / Assessment and Plan / ED Course  I have reviewed the triage vital signs and the nursing notes.  Pertinent labs & imaging results that were available during my care of the patient were reviewed by me and considered in my medical decision making (see chart for details).     Patient with pain to the anterior left knee.  No concerning symptoms for septic joint.  Neurovascularly intact.  X-ray negative for acute bony injury.  Patient appears appropriate for discharge home, agrees to RICE therapy and close orthopedic follow-up in 1 week if not improving.  Knee sleeve applied by nursing, prescription for diclofenac  Final Clinical Impressions(s) / ED Diagnoses   Final diagnoses:  Acute pain of left knee    ED Discharge Orders    None       Pauline Aus, PA-C 07/18/18 1626    Long, Arlyss Repress, MD 07/19/18 1104

## 2018-07-18 NOTE — Discharge Instructions (Addendum)
Apply ice packs on/off to your knee.  Wear the brace during the day as needed for support, but do not wear continuously or at bedtime.  Call Dr. Mort SawyersHarrison's office to arrange a follow-up appt in one week if the pain is not improving

## 2019-07-26 ENCOUNTER — Other Ambulatory Visit: Payer: Self-pay | Admitting: *Deleted

## 2019-07-26 DIAGNOSIS — Z20822 Contact with and (suspected) exposure to covid-19: Secondary | ICD-10-CM

## 2019-07-27 LAB — NOVEL CORONAVIRUS, NAA: SARS-CoV-2, NAA: NOT DETECTED

## 2020-07-09 ENCOUNTER — Other Ambulatory Visit: Payer: Self-pay

## 2020-07-09 DIAGNOSIS — Z20822 Contact with and (suspected) exposure to covid-19: Secondary | ICD-10-CM

## 2020-07-10 LAB — NOVEL CORONAVIRUS, NAA: SARS-CoV-2, NAA: NOT DETECTED

## 2020-07-16 ENCOUNTER — Other Ambulatory Visit: Payer: Self-pay

## 2020-07-16 DIAGNOSIS — Z20822 Contact with and (suspected) exposure to covid-19: Secondary | ICD-10-CM

## 2020-07-18 LAB — SARS-COV-2, NAA 2 DAY TAT

## 2020-07-18 LAB — NOVEL CORONAVIRUS, NAA: SARS-CoV-2, NAA: NOT DETECTED

## 2020-07-23 ENCOUNTER — Other Ambulatory Visit: Payer: Self-pay

## 2020-07-23 ENCOUNTER — Other Ambulatory Visit: Payer: Self-pay | Admitting: Internal Medicine

## 2020-07-23 DIAGNOSIS — Z20822 Contact with and (suspected) exposure to covid-19: Secondary | ICD-10-CM

## 2020-07-25 LAB — SARS-COV-2, NAA 2 DAY TAT

## 2020-07-25 LAB — NOVEL CORONAVIRUS, NAA: SARS-CoV-2, NAA: NOT DETECTED

## 2020-07-30 ENCOUNTER — Other Ambulatory Visit: Payer: Self-pay

## 2020-07-30 DIAGNOSIS — Z20822 Contact with and (suspected) exposure to covid-19: Secondary | ICD-10-CM

## 2020-07-30 NOTE — Addendum Note (Signed)
Addended by: Cleatrice Burke A on: 07/30/2020 09:12 AM   Modules accepted: Orders

## 2020-07-31 LAB — SARS-COV-2, NAA 2 DAY TAT

## 2020-07-31 LAB — NOVEL CORONAVIRUS, NAA: SARS-CoV-2, NAA: NOT DETECTED

## 2020-08-06 ENCOUNTER — Other Ambulatory Visit: Payer: Self-pay

## 2020-08-06 DIAGNOSIS — Z20822 Contact with and (suspected) exposure to covid-19: Secondary | ICD-10-CM

## 2020-08-06 NOTE — Addendum Note (Signed)
Addended by: Marya Landry on: 08/06/2020 09:04 AM   Modules accepted: Orders

## 2020-08-07 LAB — NOVEL CORONAVIRUS, NAA: SARS-CoV-2, NAA: NOT DETECTED

## 2020-08-07 LAB — SARS-COV-2, NAA 2 DAY TAT

## 2020-08-08 ENCOUNTER — Telehealth (HOSPITAL_COMMUNITY): Payer: Self-pay | Admitting: General Practice

## 2020-08-08 NOTE — Telephone Encounter (Signed)
Chad Blankenship called for his Covid-19 results.  Results are negative.

## 2020-08-13 ENCOUNTER — Other Ambulatory Visit: Payer: Self-pay

## 2020-08-13 DIAGNOSIS — Z20822 Contact with and (suspected) exposure to covid-19: Secondary | ICD-10-CM

## 2020-08-14 LAB — NOVEL CORONAVIRUS, NAA: SARS-CoV-2, NAA: NOT DETECTED

## 2020-08-14 LAB — SARS-COV-2, NAA 2 DAY TAT

## 2020-08-20 ENCOUNTER — Other Ambulatory Visit: Payer: Self-pay

## 2020-08-27 ENCOUNTER — Other Ambulatory Visit: Payer: Self-pay

## 2020-09-03 ENCOUNTER — Other Ambulatory Visit: Payer: Self-pay

## 2020-09-03 DIAGNOSIS — Z20822 Contact with and (suspected) exposure to covid-19: Secondary | ICD-10-CM

## 2020-09-05 LAB — NOVEL CORONAVIRUS, NAA

## 2020-09-10 ENCOUNTER — Other Ambulatory Visit: Payer: Self-pay

## 2020-09-10 DIAGNOSIS — Z20822 Contact with and (suspected) exposure to covid-19: Secondary | ICD-10-CM

## 2020-09-11 LAB — SARS-COV-2, NAA 2 DAY TAT

## 2020-09-11 LAB — NOVEL CORONAVIRUS, NAA: SARS-CoV-2, NAA: NOT DETECTED

## 2020-09-17 ENCOUNTER — Other Ambulatory Visit: Payer: Self-pay

## 2020-09-17 DIAGNOSIS — Z20822 Contact with and (suspected) exposure to covid-19: Secondary | ICD-10-CM

## 2020-09-19 LAB — NOVEL CORONAVIRUS, NAA: SARS-CoV-2, NAA: NOT DETECTED

## 2020-09-19 LAB — SARS-COV-2, NAA 2 DAY TAT

## 2020-09-24 ENCOUNTER — Other Ambulatory Visit: Payer: Self-pay

## 2020-10-01 ENCOUNTER — Other Ambulatory Visit: Payer: Self-pay

## 2020-10-01 DIAGNOSIS — Z20822 Contact with and (suspected) exposure to covid-19: Secondary | ICD-10-CM

## 2020-10-03 LAB — SARS-COV-2, NAA 2 DAY TAT

## 2020-10-03 LAB — NOVEL CORONAVIRUS, NAA: SARS-CoV-2, NAA: NOT DETECTED

## 2020-10-08 ENCOUNTER — Other Ambulatory Visit: Payer: Self-pay

## 2020-10-08 DIAGNOSIS — Z20822 Contact with and (suspected) exposure to covid-19: Secondary | ICD-10-CM

## 2020-10-09 LAB — NOVEL CORONAVIRUS, NAA: SARS-CoV-2, NAA: NOT DETECTED

## 2020-10-09 LAB — SARS-COV-2, NAA 2 DAY TAT

## 2020-10-15 ENCOUNTER — Other Ambulatory Visit: Payer: Self-pay

## 2020-10-15 DIAGNOSIS — Z20822 Contact with and (suspected) exposure to covid-19: Secondary | ICD-10-CM

## 2020-10-16 LAB — NOVEL CORONAVIRUS, NAA: SARS-CoV-2, NAA: NOT DETECTED

## 2020-10-16 LAB — SARS-COV-2, NAA 2 DAY TAT

## 2020-10-22 ENCOUNTER — Other Ambulatory Visit: Payer: Self-pay

## 2020-10-22 DIAGNOSIS — Z20822 Contact with and (suspected) exposure to covid-19: Secondary | ICD-10-CM

## 2020-10-24 LAB — SARS-COV-2, NAA 2 DAY TAT

## 2020-10-24 LAB — NOVEL CORONAVIRUS, NAA: SARS-CoV-2, NAA: NOT DETECTED

## 2020-11-06 ENCOUNTER — Other Ambulatory Visit: Payer: Self-pay

## 2020-11-06 DIAGNOSIS — Z20822 Contact with and (suspected) exposure to covid-19: Secondary | ICD-10-CM

## 2020-11-07 LAB — NOVEL CORONAVIRUS, NAA: SARS-CoV-2, NAA: NOT DETECTED

## 2020-11-07 LAB — SARS-COV-2, NAA 2 DAY TAT

## 2020-11-13 ENCOUNTER — Other Ambulatory Visit: Payer: Self-pay

## 2020-11-19 ENCOUNTER — Other Ambulatory Visit: Payer: Self-pay

## 2020-11-20 ENCOUNTER — Other Ambulatory Visit: Payer: Self-pay

## 2020-11-21 ENCOUNTER — Other Ambulatory Visit: Payer: Self-pay

## 2020-12-04 ENCOUNTER — Other Ambulatory Visit: Payer: Self-pay | Admitting: Ophthalmology

## 2020-12-04 ENCOUNTER — Other Ambulatory Visit: Payer: Self-pay

## 2020-12-04 ENCOUNTER — Emergency Department (HOSPITAL_COMMUNITY)
Admission: EM | Admit: 2020-12-04 | Discharge: 2020-12-04 | Disposition: A | Discharge status: Home / Self Care | Payer: BC Managed Care – PPO | Attending: Emergency Medicine | Admitting: Emergency Medicine

## 2020-12-04 ENCOUNTER — Encounter (HOSPITAL_COMMUNITY): Payer: Self-pay

## 2020-12-04 DIAGNOSIS — H15003 Unspecified scleritis, bilateral: Secondary | ICD-10-CM

## 2020-12-04 DIAGNOSIS — Z7984 Long term (current) use of oral hypoglycemic drugs: Secondary | ICD-10-CM | POA: Insufficient documentation

## 2020-12-04 DIAGNOSIS — Z87891 Personal history of nicotine dependence: Secondary | ICD-10-CM | POA: Insufficient documentation

## 2020-12-04 DIAGNOSIS — D72829 Elevated white blood cell count, unspecified: Secondary | ICD-10-CM

## 2020-12-04 DIAGNOSIS — L0231 Cutaneous abscess of buttock: Secondary | ICD-10-CM | POA: Insufficient documentation

## 2020-12-04 DIAGNOSIS — E1165 Type 2 diabetes mellitus with hyperglycemia: Secondary | ICD-10-CM | POA: Diagnosis not present

## 2020-12-04 DIAGNOSIS — L0291 Cutaneous abscess, unspecified: Secondary | ICD-10-CM

## 2020-12-04 HISTORY — DX: Type 2 diabetes mellitus without complications: E11.9

## 2020-12-04 LAB — CBC WITH DIFFERENTIAL/PLATELET
Basophils Absolute: 0 10*3/uL (ref 0.0–0.1)
Basophils Relative: 0 %
Eosinophils Absolute: 0 10*3/uL (ref 0.0–0.5)
Eosinophils Relative: 0 %
HCT: 46.5 % (ref 39.0–52.0)
Hemoglobin: 15.9 g/dL (ref 13.0–17.0)
Lymphocytes Relative: 34 %
Lymphs Abs: 8.4 10*3/uL — ABNORMAL HIGH (ref 0.7–4.0)
MCH: 29.7 pg (ref 26.0–34.0)
MCHC: 34.2 g/dL (ref 30.0–36.0)
MCV: 86.9 fL (ref 80.0–100.0)
Monocytes Absolute: 1.2 10*3/uL — ABNORMAL HIGH (ref 0.1–1.0)
Monocytes Relative: 5 %
Neutro Abs: 15.1 10*3/uL — ABNORMAL HIGH (ref 1.7–7.7)
Neutrophils Relative %: 61 %
Platelets: 270 10*3/uL (ref 150–400)
RBC: 5.35 MIL/uL (ref 4.22–5.81)
RDW: 13.5 % (ref 11.5–15.5)
WBC: 24.7 10*3/uL — ABNORMAL HIGH (ref 4.0–10.5)
nRBC: 0 % (ref 0.0–0.2)

## 2020-12-04 LAB — BASIC METABOLIC PANEL
Anion gap: 12 (ref 5–15)
BUN: 18 mg/dL (ref 6–20)
CO2: 19 mmol/L — ABNORMAL LOW (ref 22–32)
Calcium: 9.1 mg/dL (ref 8.9–10.3)
Chloride: 98 mmol/L (ref 98–111)
Creatinine, Ser: 1.29 mg/dL — ABNORMAL HIGH (ref 0.61–1.24)
GFR, Estimated: >60 mL/min (ref >60)
Glucose, Bld: 275 mg/dL — ABNORMAL HIGH (ref 70–99)
Potassium: 4.9 mmol/L (ref 3.5–5.1)
Sodium: 129 mmol/L — ABNORMAL LOW (ref 135–145)

## 2020-12-04 LAB — CBG MONITORING, ED: Glucose-Capillary: 306 mg/dL — ABNORMAL HIGH (ref 70–99)

## 2020-12-04 MED ORDER — SODIUM BICARBONATE 4 % IV SOLN
5.0000 mL | Freq: Once | INTRAVENOUS | Status: AC | PRN
  Administered 2020-12-04: 5 mL via INTRAVENOUS
  Filled 2020-12-04: qty 5

## 2020-12-04 MED ORDER — LIDOCAINE HCL (PF) 2 % IJ SOLN: 5.0000 mL | Freq: Once | INTRAMUSCULAR | Status: AC

## 2020-12-04 MED ORDER — CEPHALEXIN 500 MG PO CAPS: 500.0000 mg | ORAL_CAPSULE | Freq: Four times a day (QID) | ORAL | 0 refills | Status: DC

## 2020-12-04 MED ORDER — SODIUM CHLORIDE 0.9 % IV BOLUS
1000.0000 mL | Freq: Once | INTRAVENOUS | Status: AC
  Administered 2020-12-04: 1000 mL via INTRAVENOUS

## 2020-12-04 MED ORDER — HYDROCODONE-ACETAMINOPHEN 5-325 MG PO TABS: 1.0000 | ORAL_TABLET | ORAL | 0 refills | Status: DC | PRN

## 2020-12-04 MED ORDER — POVIDONE-IODINE 10 % EX SOLN
CUTANEOUS | Status: DC | PRN
  Administered 2020-12-04: 1 via TOPICAL
  Filled 2020-12-04: qty 15

## 2020-12-04 MED ORDER — LIDOCAINE HCL (PF) 2 % IJ SOLN
INTRAMUSCULAR | Status: AC
  Administered 2020-12-04: 5 mL
  Filled 2020-12-04: qty 10

## 2020-12-04 MED ORDER — DOXYCYCLINE HYCLATE 100 MG PO CAPS: 100.0000 mg | ORAL_CAPSULE | Freq: Two times a day (BID) | ORAL | 0 refills | Status: DC

## 2020-12-04 NOTE — ED Provider Notes (Signed)
[facility] EMERGENCY DEPARTMENT Provider Note   CSN: [id number] Arrival date & time: 12/04/20  1100     History Chief Complaint  Patient presents with  . Abscess    Chad Blankenship is a 40 y.o. male with a new diagnosis of diabetes (just started his medications yesterday), presenting with an approximate 4 day history of right buttock abscess which has not drained despite warm soaks at home.  He reports prior history of abscess, was seen several years ago for recurrent pilonidal abscess, ultimately surgically resolved by Dr Lovell Sheehan.  Pt denies fevers or chills, n/v, but does not feel well since his DM diagnosis reporting fatigue and dry mouth, increased urinary frequency.  Scheduled f/u recheck with his pcp in 1 week. Started metformin and Rybelsus yesterday.  The history is provided by the patient.       Past Medical History:  Diagnosis Date  . Diabetes mellitus without complication [facility])     Patient Active Problem List   Diagnosis Date Noted  . Pilonidal cyst     Past Surgical History:  Procedure Laterality Date  . EXTERNAL EAR SURGERY    . INCISE AND DRAIN ABCESS    . PILONIDAL CYST EXCISION N/A 07/28/2017   Procedure: CYST EXCISION PILONIDAL SIMPLE;  Surgeon: Franky Macho, MD;  Location: [facility];  Service: General;  Laterality: N/A;       History reviewed. No pertinent family history.  Social History   Tobacco Use  . Smoking status: Former Smoker    Packs/day: 0.50    Years: 12.00    Pack years: 6.00    Types: Cigarettes    Quit date: 11/13/2020    Years since quitting: 0.0  . Smokeless tobacco: Never Used  Vaping Use  . Vaping Use: Never used  Substance Use Topics  . Alcohol use: No  . Drug use: No    Home Medications Prior to Admission medications   Medication Sig Start Date End Date Taking? Authorizing Provider  atorvastatin (LIPITOR) 20 MG tablet Take 20 mg by mouth daily. 12/03/20  Yes [provider]  cyanocobalamin (,VITAMIN B-12,)  1000 MCG/ML injection Inject 1,000 mcg into the muscle every 30 (thirty) days. 12/02/20  Yes [provider]  cyclobenzaprine (FLEXERIL) 10 MG tablet Take 10 mg by mouth 3 (three) times daily. 06/18/20  Yes [provider]  doxycycline (VIBRAMYCIN) 100 MG capsule Take 1 capsule (100 mg total) by mouth 2 (two) times daily. 12/04/20  Yes Alleya Demeter, Raynelle Fanning, PA-C  HYDROcodone-acetaminophen (NORCO/VICODIN) 5-325 MG tablet Take 1 tablet by mouth every 4 (four) hours as needed for moderate pain. 12/04/20  Yes Mercedees Convery, Raynelle Fanning, PA-C  metFORMIN (GLUCOPHAGE) 500 MG tablet Take 500 mg by mouth 2 (two) times daily. 12/03/20  Yes [provider]  predniSONE (DELTASONE) 20 MG tablet Take 60 mg by mouth daily. 11/13/20  Yes [provider]  RYBELSUS 7 MG TABS Take 1 tablet by mouth daily. 12/03/20  Yes [provider]  diclofenac (VOLTAREN) 75 MG EC tablet Take 1 tablet (75 mg total) by mouth 2 (two) times daily. Take with food Patient not taking: Reported on 12/04/2020 07/18/18   Pauline Aus, PA-C    Allergies    Patient has no known allergies.  Review of Systems   Review of Systems  Constitutional: Positive for fatigue. Negative for fever.  Eyes: Negative.   Respiratory: Negative for chest tightness and shortness of breath.   Cardiovascular: Negative for chest pain.  Gastrointestinal: Negative for abdominal pain, nausea  and vomiting.  Endocrine: Positive for polydipsia and polyuria.  Genitourinary: Negative.   Musculoskeletal: Negative for arthralgias, joint swelling and neck pain.  Skin: Negative.  Negative for rash and wound.       Negative except as mentioned in HPI.   Neurological: Negative for dizziness, weakness, light-headedness, numbness and headaches.  Psychiatric/Behavioral: Negative.   All other systems reviewed and are negative.   Physical Exam Updated Vital Signs BP (!) 163/99 (BP Location: Left Arm)   Pulse (!) 109   Temp 99.1 F (37.3 C) (Oral)   Resp  16   Ht 6\' 2"  (1.88 m)   Wt (!) 138.3 kg   SpO2 100%   BMI 39.16 kg/m   Physical Exam Vitals and nursing note reviewed. Exam conducted with a chaperone present.  Constitutional:      Appearance: He is well-developed and well-nourished.  HENT:     Head: Normocephalic and atraumatic.  Eyes:     Conjunctiva/sclera: Conjunctivae normal.  Cardiovascular:     Rate and Rhythm: Normal rate and regular rhythm.     Pulses: Intact distal pulses.     Heart sounds: Normal heart sounds.  Pulmonary:     Effort: Pulmonary effort is normal.     Breath sounds: Normal breath sounds. No wheezing.  Abdominal:     General: Bowel sounds are normal.     Palpations: Abdomen is soft.     Tenderness: There is no abdominal tenderness.  Musculoskeletal:        General: Normal range of motion.     Cervical back: Normal range of motion.  Skin:    General: Skin is warm and dry.     Findings: Abscess present.     Comments: 4 cm raised  indurated tender abscess right medial buttock not extending to the anus and not involving the perineum.   Central pointing fluctuance but no drainage.    Neurological:     Mental Status: He is alert.  Psychiatric:        Mood and Affect: Mood and affect normal.     ED Results / Procedures / Treatments   Labs (all labs ordered are listed, but only abnormal results are displayed) Labs Reviewed  CBC WITH DIFFERENTIAL/PLATELET - Abnormal; Notable for the following components:      Result Value   WBC 24.7 (*)    Neutro Abs 15.1 (*)    Lymphs Abs 8.4 (*)    Monocytes Absolute 1.2 (*)    All other components within normal limits  BASIC METABOLIC PANEL - Abnormal; Notable for the following components:   Sodium 129 (*)    CO2 19 (*)    Glucose, Bld 275 (*)    Creatinine, Ser 1.29 (*)    All other components within normal limits  CBG MONITORING, ED - Abnormal; Notable for the following components:   Glucose-Capillary 306 (*)    All other components within normal limits     EKG None  Radiology No results found.  Procedures Procedures    INCISION AND DRAINAGE Performed by: Consent: Verbal consent obtained. Risks and benefits: risks, benefits and alternatives were discussed Type: abscess  Body area: right buttock  Anesthesia: local infiltration  Incision was made with a scalpel.  Local anesthetic: lidocaine 2% without epinephrine, using a 5:1 mix lido/bicarb  Anesthetic total: 4 ml  Complexity: complex Blunt dissection to break up loculations  Drainage: purulent  Drainage amount: moderate.  Flushed copiously until clear.  Packing material: 1/4  in iodoform gauze  Patient tolerance: Patient tolerated the procedure well with no immediate complications.     Medications Ordered in ED Medications  lidocaine HCl (PF) (XYLOCAINE) 2 % injection 5 mL (5 mLs Other Given by Other 12/04/20 1400)  sodium bicarbonate (NEUT) 4 % injection 5 mL (5 mLs Intravenous Given by Other 12/04/20 1400)  sodium chloride 0.9 % bolus 1,000 mL (0 mLs Intravenous Stopped 12/04/20 1610)    ED Course  I have reviewed the triage vital signs and the nursing notes.  Pertinent labs & imaging results that were available during my care of the patient were reviewed by me and considered in my medical decision making (see chart for details).    MDM Rules/Calculators/A&P                         Pt tolerated procedure well.  He was started on doxycycline and keflex. Hydrocodone for pain.  Pt initially noted to be tachycardic, felt secondary to pain and stress of procedure.  After procedure completed, more tachy at 130.  Labs ordered including cbc and bmet to assess for other source of tachycardia - NS IV 1 L given while awaiting labs.  Pertinent findings including an improving glucose (275, was 306 per initial finger stick), also renal insufficiency with creatinine of 1.29, mild hyponatremia which was corrected with IV bolus.  Very elevated wbc count at 24.7.  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  He was advised to contact his eye specialist for further advice about continuing this medicine and to advise of his DM diagnosis.  Prednisone could be source of wbc elevation.  Discussed admission for further hydration, lab recheck, pt opts for dc home with close f/u.  He was advised return here within 2-3 days for abscess recheck and also will need repeat labs to trend todays findings.  Return precautions discussed.   Final Clinical Impression(s) / ED Diagnoses Final diagnoses:  Abscess  Hyperglycemia due to diabetes mellitus (HCC)    Rx / DC Orders ED Discharge Orders         Ordered    doxycycline (VIBRAMYCIN) 100 MG capsule  2 times daily        12/04/20 1428    HYDROcodone-acetaminophen (NORCO/VICODIN) 5-325 MG tablet  Every 4 hours PRN        12/04/20 1428    Basic metabolic panel  Status:  Canceled        12/04/20 1442    cephALEXin (KEFLEX) 500 MG capsule  4 times daily        12/04/20 1621           Burgess Amor, PA-C 12/05/20 6720    Derwood Kaplan, MD 12/09/20 [phone]

## 2020-12-04 NOTE — ED Triage Notes (Signed)
Pt to er, pt states that he has an abscess on his R buttock.  Pt state that he has had it for the past 4 days, states that it hurts to sit.

## 2020-12-04 NOTE — Discharge Instructions (Addendum)
Start warm water soaks starting tomorrow morning.  You may remove the packing with this first soak as discussed (there is only about 2 inches of packing).  Take the entire course of the antibiotics.  You may take the hydrocodone prescribed for pain relief.  This will make you drowsy - do not drive within 4 hours of taking this medication.  Your blood glucose is elevated today as discussed as is your white blood cell count.  These labs should be repeated within the next 2-3 days along with an abscess recheck.  Return here for this as discussed by Saturday morning.     Call your eye doctor to see if they want to adjust your prednisone dosing for your eye scleritis as this medicine can also be elevating your blood glucose (and your white blood cell count).    Return here sooner for any worsening symptoms including development of fever or feeling worse.  I have provided some basic information about diabetes.  You may want to ask your primary MD about enrolling in a diabetes class to help you better understand this new diagnosis and help you manage it better.

## 2020-12-07 ENCOUNTER — Emergency Department (HOSPITAL_COMMUNITY)
Admission: EM | Admit: 2020-12-07 | Discharge: 2020-12-07 | Disposition: A | Discharge status: Home / Self Care | Payer: BC Managed Care – PPO | Attending: Emergency Medicine | Admitting: Emergency Medicine

## 2020-12-07 ENCOUNTER — Encounter (HOSPITAL_COMMUNITY): Payer: Self-pay

## 2020-12-07 ENCOUNTER — Other Ambulatory Visit: Payer: Self-pay

## 2020-12-07 DIAGNOSIS — Z48817 Encounter for surgical aftercare following surgery on the skin and subcutaneous tissue: Secondary | ICD-10-CM | POA: Insufficient documentation

## 2020-12-07 DIAGNOSIS — Z5189 Encounter for other specified aftercare: Secondary | ICD-10-CM

## 2020-12-07 DIAGNOSIS — Z87891 Personal history of nicotine dependence: Secondary | ICD-10-CM | POA: Insufficient documentation

## 2020-12-07 DIAGNOSIS — E119 Type 2 diabetes mellitus without complications: Secondary | ICD-10-CM | POA: Insufficient documentation

## 2020-12-07 DIAGNOSIS — Z7984 Long term (current) use of oral hypoglycemic drugs: Secondary | ICD-10-CM | POA: Insufficient documentation

## 2020-12-07 LAB — CBC WITH DIFFERENTIAL/PLATELET
Abs Immature Granulocytes: 0.2 10*3/uL — ABNORMAL HIGH (ref 0.00–0.07)
Basophils Absolute: 0.1 10*3/uL (ref 0.0–0.1)
Basophils Relative: 1 %
Eosinophils Absolute: 0.1 10*3/uL (ref 0.0–0.5)
Eosinophils Relative: 1 %
HCT: 41.7 % (ref 39.0–52.0)
Hemoglobin: 14.1 g/dL (ref 13.0–17.0)
Immature Granulocytes: 2 %
Lymphocytes Relative: 32 %
Lymphs Abs: 3.4 10*3/uL (ref 0.7–4.0)
MCH: 30 pg (ref 26.0–34.0)
MCHC: 33.8 g/dL (ref 30.0–36.0)
MCV: 88.7 fL (ref 80.0–100.0)
Monocytes Absolute: 1.4 10*3/uL — ABNORMAL HIGH (ref 0.1–1.0)
Monocytes Relative: 13 %
Neutro Abs: 5.3 10*3/uL (ref 1.7–7.7)
Neutrophils Relative %: 51 %
Platelets: 288 10*3/uL (ref 150–400)
RBC: 4.7 MIL/uL (ref 4.22–5.81)
RDW: 13.2 % (ref 11.5–15.5)
WBC: 10.6 10*3/uL — ABNORMAL HIGH (ref 4.0–10.5)
nRBC: 0 % (ref 0.0–0.2)

## 2020-12-07 LAB — CBG MONITORING, ED: Glucose-Capillary: 323 mg/dL — ABNORMAL HIGH (ref 70–99)

## 2020-12-07 LAB — BASIC METABOLIC PANEL
Anion gap: 7 (ref 5–15)
BUN: 12 mg/dL (ref 6–20)
CO2: 23 mmol/L (ref 22–32)
Calcium: 8.8 mg/dL — ABNORMAL LOW (ref 8.9–10.3)
Chloride: 101 mmol/L (ref 98–111)
Creatinine, Ser: 1.13 mg/dL (ref 0.61–1.24)
GFR, Estimated: >60 mL/min (ref >60)
Glucose, Bld: 310 mg/dL — ABNORMAL HIGH (ref 70–99)
Potassium: 3.9 mmol/L (ref 3.5–5.1)
Sodium: 131 mmol/L — ABNORMAL LOW (ref 135–145)

## 2020-12-07 NOTE — ED Notes (Signed)
Pt in er room number 9, pt states that he was here on Wednesday and they did and I and D of an abscess on his R buttock, pt has dressing in place, pt appears to have a second small pea sized abscess near the original abscess.  No drainage or redness noted at this time.

## 2020-12-07 NOTE — ED Triage Notes (Signed)
Pt presents to ED for recheck of abscess on R buttock and to have labs rechecked.

## 2020-12-07 NOTE — Discharge Instructions (Addendum)
Continue your antibiotic doxycycline.  Keep your appointment to follow-up with your regular doctor regarding your new diagnosis of diabetes.  Continue to soak your buttocks.  The I&D from the previous abscess is healing well.  There is an area on that same side of the buttocks that does have some induration.  This will need to be watched carefully this could turn into an abscess as well.  But hopefully the antibiotics will prevent that.

## 2020-12-07 NOTE — ED Provider Notes (Signed)
[facility] EMERGENCY DEPARTMENT Provider Note   CSN: [id number] Arrival date & time: 12/07/20  1034     History Chief Complaint  Patient presents with  . Wound Check    Chad Blankenship is a 40 y.o. male.  Patient seen February 2.  Was having an abscess on his right buttocks area.  It was I&D by the physician assistant.  And patient also with new diagnosis of diabetes.  Started on Metformin.  His follow-up with his primary care doctor later this week.  Also being treated with doxycycline which she still taking.  Patient here for recheck of the wound.        Past Medical History:  Diagnosis Date  . Diabetes mellitus without complication [facility])     Patient Active Problem List   Diagnosis Date Noted  . Pilonidal cyst     Past Surgical History:  Procedure Laterality Date  . EXTERNAL EAR SURGERY    . INCISE AND DRAIN ABCESS    . PILONIDAL CYST EXCISION N/A 07/28/2017   Procedure: CYST EXCISION PILONIDAL SIMPLE;  Surgeon: Franky Macho, MD;  Location: [facility];  Service: General;  Laterality: N/A;       No family history on file.  Social History   Tobacco Use  . Smoking status: Former Smoker    Packs/day: 0.50    Years: 12.00    Pack years: 6.00    Types: Cigarettes    Quit date: 11/13/2020    Years since quitting: 0.0  . Smokeless tobacco: Never Used  Vaping Use  . Vaping Use: Never used  Substance Use Topics  . Alcohol use: No  . Drug use: No    Home Medications Prior to Admission medications   Medication Sig Start Date End Date Taking? Authorizing Provider  atorvastatin (LIPITOR) 20 MG tablet Take 20 mg by mouth daily. 12/03/20   [provider]  cephALEXin (KEFLEX) 500 MG capsule Take 1 capsule (500 mg total) by mouth 4 (four) times daily. 12/04/20   Burgess Amor, PA-C  cyanocobalamin (,VITAMIN B-12,) 1000 MCG/ML injection Inject 1,000 mcg into the muscle every 30 (thirty) days. 12/02/20   [provider]  cyclobenzaprine (FLEXERIL) 10 MG  tablet Take 10 mg by mouth 3 (three) times daily. 06/18/20   [provider]  diclofenac (VOLTAREN) 75 MG EC tablet Take 1 tablet (75 mg total) by mouth 2 (two) times daily. Take with food Patient not taking: Reported on 12/04/2020 07/18/18   Pauline Aus, PA-C  doxycycline (VIBRAMYCIN) 100 MG capsule Take 1 capsule (100 mg total) by mouth 2 (two) times daily. 12/04/20   Burgess Amor, PA-C  HYDROcodone-acetaminophen (NORCO/VICODIN) 5-325 MG tablet Take 1 tablet by mouth every 4 (four) hours as needed for moderate pain. 12/04/20   Burgess Amor, PA-C  metFORMIN (GLUCOPHAGE) 500 MG tablet Take 500 mg by mouth 2 (two) times daily. 12/03/20   [provider]  predniSONE (DELTASONE) 20 MG tablet Take 60 mg by mouth daily. 11/13/20   [provider]  RYBELSUS 7 MG TABS Take 1 tablet by mouth daily. 12/03/20   [provider]    Allergies    Patient has no known allergies.  Review of Systems   Review of Systems  Constitutional: Negative for chills and fever.  HENT: Negative for rhinorrhea and sore throat.   Eyes: Negative for visual disturbance.  Respiratory: Negative for cough and shortness of breath.   Cardiovascular: Negative for chest pain and leg swelling.  Gastrointestinal: Negative for abdominal  pain, diarrhea, nausea and vomiting.  Genitourinary: Negative for dysuria.  Musculoskeletal: Negative for back pain and neck pain.  Skin: Positive for wound. Negative for rash.  Neurological: Negative for dizziness, light-headedness and headaches.  Hematological: Does not bruise/bleed easily.  Psychiatric/Behavioral: Negative for confusion.    Physical Exam Updated Vital Signs BP 108/82 (BP Location: Right Arm)   Pulse 86   Temp 98.3 F (36.8 C) (Oral)   Resp 18   Ht 1.88 m (6\' 2" )   Wt (!) 138.3 kg   SpO2 100%   BMI 39.16 kg/m   Physical Exam Vitals and nursing note reviewed.  Constitutional:      Appearance: Normal appearance. He is well-developed and  well-nourished.  HENT:     Head: Normocephalic and atraumatic.  Eyes:     Extraocular Movements: Extraocular movements intact.     Conjunctiva/sclera: Conjunctivae normal.     Pupils: Pupils are equal, round, and reactive to light.  Cardiovascular:     Rate and Rhythm: Normal rate and regular rhythm.     Heart sounds: No murmur heard.   Pulmonary:     Effort: Pulmonary effort is normal. No respiratory distress.     Breath sounds: Normal breath sounds.  Abdominal:     Palpations: Abdomen is soft.     Tenderness: There is no abdominal tenderness.  Musculoskeletal:        General: No edema. Normal range of motion.     Cervical back: Neck supple.  Skin:    General: Skin is warm and dry.     Comments: Right buttocks area was several small superficial pustules same on the left buttocks area.  One a little bit inferior to the one that was I&D which is healing well.  Does have an area of some induration around it but no fluctuance.  The little pustule areas are about 5 mm in size.  Neurological:     General: No focal deficit present.     Mental Status: He is alert and oriented to person, place, and time.     Cranial Nerves: No cranial nerve deficit.     Sensory: No sensory deficit.  Psychiatric:        Mood and Affect: Mood and affect normal.     ED Results / Procedures / Treatments   Labs (all labs ordered are listed, but only abnormal results are displayed) Labs Reviewed  CBC WITH DIFFERENTIAL/PLATELET - Abnormal; Notable for the following components:      Result Value   WBC 10.6 (*)    Monocytes Absolute 1.4 (*)    Abs Immature Granulocytes 0.20 (*)    All other components within normal limits  BASIC METABOLIC PANEL - Abnormal; Notable for the following components:   Sodium 131 (*)    Glucose, Bld 310 (*)    Calcium 8.8 (*)    All other components within normal limits  CBG MONITORING, ED - Abnormal; Notable for the following components:   Glucose-Capillary 323 (*)    All  other components within normal limits    EKG None  Radiology No results found.  Procedures Procedures   Medications Ordered in ED Medications - No data to display  ED Course  I have reviewed the triage vital signs and the nursing notes.  Pertinent labs & imaging results that were available during my care of the patient were reviewed by me and considered in my medical decision making (see chart for details).    MDM Rules/Calculators/A&P  Patient blood sugars here still in the 300s.  The buttocks wound that was I&D is healing well.  There are some areas of other pustules that do not require I&D at this time.  Patient will continue the doxycycline will continue sitz bath's.  And patient has follow-up for follow-up of his new diagnosis of diabetes and his new onset of the Metformin.  This coming week with his primary care doctor.  Patient nontoxic no acute distress.  Patient cautioned about the 1 area that has some induration that if he gets worse that may need to be I&D as well.  But no evidence of fluctuance at this time.  Final Clinical Impression(s) / ED Diagnoses Final diagnoses:  Wound check, abscess    Rx / DC Orders ED Discharge Orders    None       Vanetta Mulders, MD 12/07/20 1359

## 2020-12-10 ENCOUNTER — Other Ambulatory Visit: Payer: BC Managed Care – PPO

## 2020-12-17 ENCOUNTER — Other Ambulatory Visit: Payer: BC Managed Care – PPO

## 2020-12-25 ENCOUNTER — Other Ambulatory Visit: Payer: BC Managed Care – PPO

## 2020-12-27 ENCOUNTER — Other Ambulatory Visit: Payer: Self-pay | Admitting: Ophthalmology

## 2020-12-27 ENCOUNTER — Ambulatory Visit
Admit: 2020-12-27 | Discharge: 2020-12-27 | Disposition: A | Discharge status: Home / Self Care | Payer: BC Managed Care – PPO | Attending: Ophthalmology | Admitting: Ophthalmology

## 2020-12-27 ENCOUNTER — Ambulatory Visit
Admission: RE | Admit: 2020-12-27 | Discharge: 2020-12-27 | Disposition: A | Discharge status: Home / Self Care | Payer: BC Managed Care – PPO | Source: Ambulatory Visit | Attending: Ophthalmology | Admitting: Ophthalmology

## 2020-12-27 DIAGNOSIS — H15003 Unspecified scleritis, bilateral: Secondary | ICD-10-CM

## 2020-12-27 MED ORDER — GADOBENATE DIMEGLUMINE 529 MG/ML IV SOLN
20.0000 mL | Freq: Once | INTRAVENOUS | Status: AC | PRN
  Administered 2020-12-27: 20 mL via INTRAVENOUS

## 2021-01-01 ENCOUNTER — Other Ambulatory Visit: Payer: BC Managed Care – PPO

## 2021-05-01 ENCOUNTER — Emergency Department (HOSPITAL_COMMUNITY)
Admission: EM | Admit: 2021-05-01 | Discharge: 2021-05-01 | Disposition: A | Discharge status: Home / Self Care | Payer: BC Managed Care – PPO | Attending: Emergency Medicine | Admitting: Emergency Medicine

## 2021-05-01 ENCOUNTER — Other Ambulatory Visit: Payer: Self-pay

## 2021-05-01 DIAGNOSIS — L02811 Cutaneous abscess of head [any part, except face]: Secondary | ICD-10-CM | POA: Diagnosis not present

## 2021-05-01 DIAGNOSIS — Z7984 Long term (current) use of oral hypoglycemic drugs: Secondary | ICD-10-CM | POA: Diagnosis not present

## 2021-05-01 DIAGNOSIS — L723 Sebaceous cyst: Secondary | ICD-10-CM | POA: Insufficient documentation

## 2021-05-01 DIAGNOSIS — L0291 Cutaneous abscess, unspecified: Secondary | ICD-10-CM

## 2021-05-01 DIAGNOSIS — R220 Localized swelling, mass and lump, head: Secondary | ICD-10-CM | POA: Diagnosis present

## 2021-05-01 DIAGNOSIS — Z87891 Personal history of nicotine dependence: Secondary | ICD-10-CM | POA: Insufficient documentation

## 2021-05-01 DIAGNOSIS — E119 Type 2 diabetes mellitus without complications: Secondary | ICD-10-CM | POA: Insufficient documentation

## 2021-05-01 MED ORDER — CEPHALEXIN 500 MG PO CAPS: 500.0000 mg | ORAL_CAPSULE | Freq: Four times a day (QID) | ORAL | 0 refills | Status: AC

## 2021-05-01 NOTE — ED Provider Notes (Signed)
[facility] EMERGENCY DEPARTMENT Provider Note   CSN: [id number] Arrival date & time: 05/01/21  1755     History Chief Complaint  Patient presents with   Abscess    Chad Blankenship is a 40 y.o. male with PMHx diabetes who presents to the ED today with complaint of area of redness, swelling, and pain to back of head. Pt states he has had a small cyst underneath the skin most of his life however 4 days ago it became larger in size and painful. He has not taken anything for pain. He has not applied warm compresses however reports improvement in pain with hot shower. No drainage. No other complaints at this time including fevers or chills.   The history is provided by the patient and medical records.      Past Medical History:  Diagnosis Date   Diabetes mellitus without complication [facility])     Patient Active Problem List   Diagnosis Date Noted   Pilonidal cyst     Past Surgical History:  Procedure Laterality Date   EXTERNAL EAR SURGERY     INCISE AND DRAIN ABCESS     PILONIDAL CYST EXCISION N/A 07/28/2017   Procedure: CYST EXCISION PILONIDAL SIMPLE;  Surgeon: Franky Macho, MD;  Location: [facility];  Service: General;  Laterality: N/A;       No family history on file.  Social History   Tobacco Use   Smoking status: Former    Packs/day: 0.50    Years: 12.00    Pack years: 6.00    Types: Cigarettes    Quit date: 11/13/2020    Years since quitting: 0.4   Smokeless tobacco: Never  Vaping Use   Vaping Use: Never used  Substance Use Topics   Alcohol use: No   Drug use: No    Home Medications Prior to Admission medications   Medication Sig Start Date End Date Taking? Authorizing Provider  cephALEXin (KEFLEX) 500 MG capsule Take 1 capsule (500 mg total) by mouth 4 (four) times daily for 7 days. 05/01/21 05/08/21 Yes Medhansh Brinkmeier, PA-C  atorvastatin (LIPITOR) 20 MG tablet Take 20 mg by mouth daily. 12/03/20   [provider]  cephALEXin (KEFLEX) 500 MG capsule  Take 1 capsule (500 mg total) by mouth 4 (four) times daily. 12/04/20   Burgess Amor, PA-C  cyanocobalamin (,VITAMIN B-12,) 1000 MCG/ML injection Inject 1,000 mcg into the muscle every 30 (thirty) days. 12/02/20   [provider]  cyclobenzaprine (FLEXERIL) 10 MG tablet Take 10 mg by mouth 3 (three) times daily. 06/18/20   [provider]  diclofenac (VOLTAREN) 75 MG EC tablet Take 1 tablet (75 mg total) by mouth 2 (two) times daily. Take with food Patient not taking: Reported on 12/04/2020 07/18/18   Pauline Aus, PA-C  doxycycline (VIBRAMYCIN) 100 MG capsule Take 1 capsule (100 mg total) by mouth 2 (two) times daily. 12/04/20   Burgess Amor, PA-C  HYDROcodone-acetaminophen (NORCO/VICODIN) 5-325 MG tablet Take 1 tablet by mouth every 4 (four) hours as needed for moderate pain. 12/04/20   Burgess Amor, PA-C  metFORMIN (GLUCOPHAGE) 500 MG tablet Take 500 mg by mouth 2 (two) times daily. 12/03/20   [provider]  predniSONE (DELTASONE) 20 MG tablet Take 60 mg by mouth daily. 11/13/20   [provider]  RYBELSUS 7 MG TABS Take 1 tablet by mouth daily. 12/03/20   [provider]    Allergies    Patient has no known allergies.  Review of Systems  Review of Systems  Constitutional:  Negative for chills and fever.  Skin:        + redness, swelling, pain to back of head   All other systems reviewed and are negative.  Physical Exam Updated Vital Signs BP (!) 147/103 (BP Location: Right Wrist)   Pulse 84   Temp 98.2 F (36.8 C) (Oral)   Resp 20   Ht 6\' 3"  (1.905 m)   Wt 132.9 kg   SpO2 93%   BMI 36.62 kg/m   Physical Exam Vitals and nursing note reviewed.  Constitutional:      Appearance: He is not ill-appearing.  HENT:     Head: Normocephalic and atraumatic.  Eyes:     Conjunctiva/sclera: Conjunctivae normal.  Cardiovascular:     Rate and Rhythm: Normal rate and regular rhythm.  Pulmonary:     Effort: Pulmonary effort is normal.     Breath sounds:  Normal breath sounds.  Abdominal:     Palpations: Abdomen is soft.     Tenderness: There is no abdominal tenderness.  Musculoskeletal:     Cervical back: Neck supple.  Skin:    General: Skin is warm and dry.     Coloration: Skin is not jaundiced.     Comments: 2 x 2 cm area of induration to midline occiput area with TTP. No obvious erythema appreciated.   Neurological:     Mental Status: He is alert.    ED Results / Procedures / Treatments   Labs (all labs ordered are listed, but only abnormal results are displayed) Labs Reviewed - No data to display  EKG None  Radiology No results found.  Procedures Procedures   Medications Ordered in ED Medications - No data to display  ED Course  I have reviewed the triage vital signs and the nursing notes.  Pertinent labs & imaging results that were available during my care of the patient were reviewed by me and considered in my medical decision making (see chart for details).    MDM Rules/Calculators/A&P                          40 year old male presenting to the ED today with complaint of pain,redness, swelling to posterior head for the past 4 days. Hx of cyst in this area most of his life however never painful. On arrival to the ED VSS. Pt appears to be in NAD. He has a 2 x 2 cm area of induration to his midline occiput area with TTP. No erythema or increased warmth appreciated. Likely possible infected sebaceous cyst. Given no obvious fluctuance do not feel there is a drainable abscess at this time. Will discharge home with antibiotics. Pt instructed on Ibuprofen/Tylenol PRN for pain, warm compresses, and return in 48 hours to PCP or ED for further evaluation. Pt in agreement with plan and stable for discharge home.   This note was prepared using Dragon voice recognition software and may include unintentional dictation errors due to the inherent limitations of voice recognition software.  Final Clinical Impression(s) / ED  Diagnoses Final diagnoses:  Abscess  Sebaceous cyst    Rx / DC Orders ED Discharge Orders          Ordered    cephALEXin (KEFLEX) 500 MG capsule  4 times daily        05/01/21 1909             Discharge Instructions  Please pick up antibiotics and take as prescribed.  I would recommend Ibuprofen and Tylenol as needed for pain and applying warm compresses to the area.  Return to the ED or your PCP in 48 hours for recheck.        Tanda Rockers, PA-C 05/01/21 Dorothy Spark, MD 05/02/21 1038

## 2021-05-01 NOTE — Discharge Instructions (Addendum)
Please pick up antibiotics and take as prescribed.  I would recommend Ibuprofen and Tylenol as needed for pain and applying warm compresses to the area.  Return to the ED or your PCP in 48 hours for recheck.

## 2021-05-01 NOTE — ED Triage Notes (Signed)
Lesion to back of head, states it has been there for most of his life and the last 4 days it has become painful

## 2021-06-27 ENCOUNTER — Other Ambulatory Visit: Payer: Self-pay

## 2021-06-27 ENCOUNTER — Emergency Department (HOSPITAL_COMMUNITY)
Admission: EM | Admit: 2021-06-27 | Discharge: 2021-06-27 | Disposition: A | Discharge status: Home / Self Care | Payer: BC Managed Care – PPO | Attending: Emergency Medicine | Admitting: Emergency Medicine

## 2021-06-27 ENCOUNTER — Emergency Department (HOSPITAL_COMMUNITY): Payer: BC Managed Care – PPO

## 2021-06-27 DIAGNOSIS — R61 Generalized hyperhidrosis: Secondary | ICD-10-CM | POA: Diagnosis not present

## 2021-06-27 DIAGNOSIS — I10 Essential (primary) hypertension: Secondary | ICD-10-CM | POA: Insufficient documentation

## 2021-06-27 DIAGNOSIS — E119 Type 2 diabetes mellitus without complications: Secondary | ICD-10-CM | POA: Insufficient documentation

## 2021-06-27 DIAGNOSIS — R072 Precordial pain: Secondary | ICD-10-CM | POA: Diagnosis present

## 2021-06-27 DIAGNOSIS — U071 COVID-19: Secondary | ICD-10-CM | POA: Diagnosis not present

## 2021-06-27 DIAGNOSIS — Z79899 Other long term (current) drug therapy: Secondary | ICD-10-CM | POA: Diagnosis not present

## 2021-06-27 DIAGNOSIS — Z87891 Personal history of nicotine dependence: Secondary | ICD-10-CM | POA: Diagnosis not present

## 2021-06-27 DIAGNOSIS — Z7982 Long term (current) use of aspirin: Secondary | ICD-10-CM | POA: Diagnosis not present

## 2021-06-27 DIAGNOSIS — Z7984 Long term (current) use of oral hypoglycemic drugs: Secondary | ICD-10-CM | POA: Insufficient documentation

## 2021-06-27 LAB — CBC WITH DIFFERENTIAL/PLATELET
Abs Immature Granulocytes: 0.01 10*3/uL (ref 0.00–0.07)
Basophils Absolute: 0.1 10*3/uL (ref 0.0–0.1)
Basophils Relative: 1 %
Eosinophils Absolute: 0.1 10*3/uL (ref 0.0–0.5)
Eosinophils Relative: 1 %
HCT: 46.9 % (ref 39.0–52.0)
Hemoglobin: 15.2 g/dL (ref 13.0–17.0)
Immature Granulocytes: 0 %
Lymphocytes Relative: 51 %
Lymphs Abs: 2.8 10*3/uL (ref 0.7–4.0)
MCH: 27.9 pg (ref 26.0–34.0)
MCHC: 32.4 g/dL (ref 30.0–36.0)
MCV: 86.1 fL (ref 80.0–100.0)
Monocytes Absolute: 0.8 10*3/uL (ref 0.1–1.0)
Monocytes Relative: 13 %
Neutro Abs: 1.9 10*3/uL (ref 1.7–7.7)
Neutrophils Relative %: 34 %
Platelets: 343 10*3/uL (ref 150–400)
RBC: 5.45 MIL/uL (ref 4.22–5.81)
RDW: 15 % (ref 11.5–15.5)
WBC: 5.7 10*3/uL (ref 4.0–10.5)
nRBC: 0 % (ref 0.0–0.2)

## 2021-06-27 LAB — COMPREHENSIVE METABOLIC PANEL
ALT: 27 U/L (ref 0–44)
AST: 28 U/L (ref 15–41)
Albumin: 4 g/dL (ref 3.5–5.0)
Alkaline Phosphatase: 97 U/L (ref 38–126)
Anion gap: 11 (ref 5–15)
BUN: 23 mg/dL — ABNORMAL HIGH (ref 6–20)
CO2: 19 mmol/L — ABNORMAL LOW (ref 22–32)
Calcium: 9.2 mg/dL (ref 8.9–10.3)
Chloride: 104 mmol/L (ref 98–111)
Creatinine, Ser: 1.9 mg/dL — ABNORMAL HIGH (ref 0.61–1.24)
GFR, Estimated: 46 mL/min — ABNORMAL LOW (ref >60)
Glucose, Bld: 106 mg/dL — ABNORMAL HIGH (ref 70–99)
Potassium: 4.3 mmol/L (ref 3.5–5.1)
Sodium: 134 mmol/L — ABNORMAL LOW (ref 135–145)
Total Bilirubin: 0.7 mg/dL (ref 0.3–1.2)
Total Protein: 7.5 g/dL (ref 6.5–8.1)

## 2021-06-27 LAB — LIPASE, BLOOD: Lipase: 31 U/L (ref 11–51)

## 2021-06-27 LAB — RESP PANEL BY RT-PCR (FLU A&B, COVID) ARPGX2
Influenza A by PCR: NEGATIVE
Influenza B by PCR: NEGATIVE
SARS Coronavirus 2 by RT PCR: POSITIVE — AB

## 2021-06-27 LAB — TROPONIN I (HIGH SENSITIVITY)
Troponin I (High Sensitivity): 7 ng/L (ref <18)
Troponin I (High Sensitivity): 5 ng/L (ref <18)

## 2021-06-27 LAB — D-DIMER, QUANTITATIVE: D-Dimer, Quant: 0.41 ug/mL-FEU (ref 0.00–0.50)

## 2021-06-27 MED ORDER — SODIUM CHLORIDE 0.9 % IV BOLUS
1000.0000 mL | Freq: Once | INTRAVENOUS | Status: AC
  Administered 2021-06-27: 1000 mL via INTRAVENOUS

## 2021-06-27 NOTE — ED Provider Notes (Signed)
Emergency Medicine Provider Triage Evaluation Note  Chad Blankenship , a 40 y.o. male  was evaluated in triage.  Pt complains of substernal chest pain.  He was driving when this started.  He denies nausea or vomiting.  He reports that he has been feeling hot and cold with breaking out in sweats since this started.  He notes that he has had a cold since Monday.  He did not get tested for COVID.Marland Kitchen  Review of Systems  Positive: Chest pain, diaphoresis, Negative: Vomiting  Physical Exam  BP 111/66 (BP Location: Right Arm)   Pulse (!) 102   Temp 98.3 F (36.8 C) (Oral)   Resp 16   Ht 6\' 3"  (1.905 m)   Wt (!) 145.2 kg   SpO2 99%   BMI 40.00 kg/m  Gen:   Awake, no distress   Resp:  Normal effort  MSK:   Moves extremities without difficulty  Other:  No obvious diaphoresis.  He is awake and alert, answers questions without difficulty.  Medical Decision Making  Medically screening exam initiated at 1:26 PM.  Appropriate orders placed.  Chad Blankenship was informed that the remainder of the evaluation will be completed by another provider, this initial triage assessment does not replace that evaluation, and the importance of remaining in the ED until their evaluation is complete.  Note: Portions of this report may have been transcribed using voice recognition software. Every effort was made to ensure accuracy; however, inadvertent computerized transcription errors may be present     Chad Blankenship 06/27/21 1327    06/29/21, MD 06/27/21 1343

## 2021-06-27 NOTE — ED Provider Notes (Signed)
[facility] EMERGENCY DEPARTMENT Provider Note   CSN: [id number] Arrival date & time: 06/27/21  1311     History Chief Complaint  Patient presents with   Chest Pain    Chad Blankenship is a 40 y.o. male  The history is provided by the patient. No language interpreter was used.  Chest Pain Pain location:  Substernal area Pain quality: aching   Pain radiates to:  Does not radiate Pain severity:  Mild Onset quality:  Sudden Duration:  2 hours Timing:  Constant Progression:  Resolved Chronicity:  New Context: at rest   Relieved by:  Nothing Worsened by:  Nothing Ineffective treatments:  None tried Associated symptoms: diaphoresis and fatigue   Associated symptoms: no abdominal pain, no AICD problem, no altered mental status, no anorexia, no anxiety, no back pain, no claudication, no cough, no dizziness, no dysphagia, no fever, no headache, no heartburn, no lower extremity edema, no nausea, no near-syncope, no numbness, no orthopnea, no palpitations, no PND, no shortness of breath, no syncope, no vomiting and no weakness   Risk factors: diabetes mellitus, high cholesterol, hypertension and male sex   Risk factors: no smoking     40 year old male here with complaint of onset of chest pain and diaphoresis.  He has a past medical history of diabetes hypertension, hyperlipidemia.  Patient quit smoking in February of this year.  He has a previous MI.  He has a strong family history of CAD/MI.  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  He denies any shortness of breath, nausea or vomiting.  He has had a recent URI symptoms with nasal congestion has been taking a decongestant daily except for today.  He denies unilateral leg swelling, pleuritic chest pain, hemoptysis. HPI: A 40 year old patient with a  history of treated diabetes, hypertension, hypercholesterolemia and obesity presents for evaluation of chest pain. Initial onset of pain was less than one hour ago. The patient's chest pain is not worse with exertion. The patient reports some diaphoresis. The patient's chest pain is middle- or left-sided, is not well-localized, is not described as heaviness/pressure/tightness, is not sharp and does not radiate to the arms/jaw/neck. The patient does not complain of nausea. The patient has a family history of coronary artery disease in a first-degree relative with onset less than age 90. The patient has no history of stroke, has no history of peripheral artery disease and has not smoked in the past 90 days.   Past Medical History:  Diagnosis Date   Diabetes mellitus without complication [facility])     Patient Active Problem List   Diagnosis Date Noted   Pilonidal cyst     Past Surgical History:  Procedure Laterality Date   EXTERNAL EAR SURGERY     INCISE AND DRAIN ABCESS     PILONIDAL CYST EXCISION N/A 07/28/2017   Procedure: CYST EXCISION PILONIDAL SIMPLE;  Surgeon: Franky Macho, MD;  Location: [facility];  Service: General;  Laterality: N/A;       No family history on file.  Social History   Tobacco Use   Smoking status: Former    Packs/day: 0.50    Years: 12.00    Pack years: 6.00    Types: Cigarettes    Quit date: 11/13/2020    Years since quitting: 0.6   Smokeless  tobacco: Never  Vaping Use   Vaping Use: Never used  Substance Use Topics   Alcohol use: No   Drug use: No    Home Medications Prior to Admission medications   Medication Sig Start Date End Date Taking? Authorizing Provider  aspirin 325 MG EC tablet Take 650 mg by mouth in the morning.   Yes [provider]  atorvastatin (LIPITOR) 80 MG tablet Take 80 mg by mouth at bedtime. 03/24/21  Yes [provider]  azaTHIOprine (IMURAN) 50 MG tablet Take 100 mg by mouth daily. 04/01/21  Yes [provider]  carvedilol (COREG) 6.25 MG tablet Take 6.25 mg by mouth 2 (two) times daily. 03/25/21  Yes [provider]  hydrALAZINE (APRESOLINE) 25 MG tablet Take 25 mg by mouth 3 (three) times daily. 03/25/21  Yes [provider]  lisinopril (ZESTRIL) 20 MG tablet Take 20 mg by mouth daily. 06/20/21  Yes [provider]  metFORMIN (GLUCOPHAGE) 500 MG tablet Take 500 mg by mouth 2 (two) times daily. 12/03/20  Yes [provider]  nitroGLYCERIN (NITROSTAT) 0.4 MG SL tablet Place 0.4 mg under the tongue every 5 (five) minutes as needed for chest pain. 02/04/21  Yes [provider]  prasugrel (EFFIENT) 10 MG TABS tablet Take 10 mg by mouth daily. 03/25/21  Yes [provider]  cephALEXin (KEFLEX) 500 MG capsule Take 1 capsule (500 mg total) by mouth 4 (four) times daily. Patient not taking: Reported on 06/27/2021 12/04/20   Burgess AmorIdol, Julie, PA-C  diclofenac (VOLTAREN) 75 MG EC tablet Take 1 tablet (75 mg total) by mouth 2 (two) times daily. Take with food Patient not taking: No sig reported 07/18/18   Triplett, Tammy, PA-C  doxycycline (VIBRAMYCIN) 100 MG capsule Take 1 capsule (100 mg total) by mouth 2 (two) times daily. Patient not taking: Reported on 06/27/2021 12/04/20   Burgess AmorIdol, Julie, PA-C  HYDROcodone-acetaminophen (NORCO/VICODIN) 5-325 MG tablet Take 1 tablet by mouth every 4 (four) hours as needed for moderate pain. Patient not taking: Reported on 06/27/2021 12/04/20   Burgess AmorIdol, Julie, PA-C    Allergies    Patient has no known allergies.  Review of Systems   Review of Systems  Constitutional:  Positive for activity change, appetite change, diaphoresis and fatigue. Negative for fever.  HENT:  Positive for congestion. Negative for trouble swallowing.   Respiratory:  Negative for cough, chest tightness, shortness of breath and wheezing.   Cardiovascular:  Positive for chest pain. Negative for palpitations, orthopnea, claudication, syncope, PND and  near-syncope.  Gastrointestinal:  Negative for abdominal pain, anorexia, heartburn, nausea and vomiting.  Musculoskeletal:  Negative for back pain.  Skin:  Negative for rash.  Neurological:  Negative for dizziness, weakness, numbness and headaches.   Physical Exam Updated Vital Signs BP 120/77   Pulse 79   Temp 98.3 F (36.8 C) (Oral)   Resp 15   Ht 6\' 3"  (1.905 m)   Wt (!) 145.2 kg   SpO2 100%   BMI 40.00 kg/m   Physical Exam Vitals and nursing note reviewed.  Constitutional:      General: He is not in acute distress.    Appearance: He is well-developed. He is not diaphoretic.  HENT:     Head: Normocephalic and atraumatic.  Eyes:     General: No scleral icterus.    Conjunctiva/sclera: Conjunctivae normal.  Cardiovascular:     Rate and Rhythm: Normal rate and regular rhythm.     Heart sounds: Normal heart sounds.  Pulmonary:     Effort: Pulmonary effort is normal. No respiratory distress.     Breath sounds: Normal breath sounds.  Abdominal:     Palpations: Abdomen is soft.     Tenderness: There is no abdominal tenderness.  Musculoskeletal:     Cervical back: Normal range of motion and neck supple.  Skin:    General: Skin is warm and dry.  Neurological:     Mental Status: He is alert.  Psychiatric:        Behavior: Behavior normal.    ED Results / Procedures / Treatments   Labs (all labs ordered are listed, but only abnormal results are displayed) Labs Reviewed  RESP PANEL BY RT-PCR (FLU A&B, COVID) ARPGX2 - Abnormal; Notable for the following components:      Result Value   SARS Coronavirus 2 by RT PCR POSITIVE (*)    All other components within normal limits  COMPREHENSIVE METABOLIC PANEL - Abnormal; Notable for the following components:   Sodium 134 (*)    CO2 19 (*)    Glucose, Bld 106 (*)    BUN 23 (*)    Creatinine, Ser 1.90 (*)    GFR, Estimated 46 (*)    All other components within normal limits  CBC WITH DIFFERENTIAL/PLATELET  LIPASE, BLOOD   D-DIMER, QUANTITATIVE  TROPONIN I (HIGH SENSITIVITY)  TROPONIN I (HIGH SENSITIVITY)    EKG EKG Interpretation  Date/Time:  Friday June 27 2021 13:17:46 EDT Ventricular Rate:  106 PR Interval:  162 QRS Duration: 94 QT Interval:  332 QTC Calculation: 441 R Axis:   102 Text Interpretation: Sinus tachycardia Rightward axis Inferior infarct , age undetermined Abnormal ECG Confirmed by Norman Clay ([id number]) on 06/27/2021 5:05:01 PM  Radiology DG Chest 2 View  Result Date: 06/27/2021 CLINICAL DATA:  Chest pain EXAM: CHEST - 2 VIEW COMPARISON:  12/27/2020 FINDINGS: The heart size and mediastinal contours are within normal limits. No focal airspace consolidation, pleural effusion, or pneumothorax. The visualized skeletal structures are unremarkable. IMPRESSION: No active cardiopulmonary disease. Electronically Signed   By: Duanne Guess D.O.   On: 06/27/2021 14:10    Procedures Procedures   Medications Ordered in ED Medications  sodium chloride 0.9 % bolus 1,000 mL (has no administration in time range)    ED Course  I have reviewed the triage vital signs and the nursing notes.  Pertinent labs & imaging results that were available during my care of the patient were reviewed by me and considered in my medical decision making (see chart for details).    MDM Rules/Calculators/A&P HEAR Score: 4                         Given the large differential diagnosis for Chad Blankenship, the decision making in this case is of high complexity.  I ordered and reviewed labs which include CBC which shows no elevated white blood cell count, CMP with mild AKI being repleted here with fluid bolus.  Respiratory panel positive for coronavirus.  Negative D-dimer, negative lipase and negative troponin x2.  I ordered and reviewed two-view chest x-ray which shows no acute abnormalities  EKG shows sinus tachycardia at a rate of 106.  After evaluating all of the data points in this case, the  presentation of Chad Blankenship is NOT consistent with Acute Coronary Syndrome (ACS) and/or myocardial ischemia, pulmonary embolism, aortic dissection; Chad Blankenship, significant arrythmia, pneumothorax, cardiac tamponade, or other emergent cardiopulmonary condition.  Further, the presentation of  Chad Blankenship is NOT consistent with pericarditis, myocarditis, cholecystitis, pancreatitis, mediastinitis, endocarditis, new valvular disease.  Additionally, the presentation of Chad Blankenship NOT consistent with flail chest, cardiac contusion, ARDS, or significant intra-thoracic or intra-abdominal bleeding.  Moreover, this presentation is NOT consistent with pneumonia, sepsis, or pyelonephritis.     Strict return and follow-up precautions have been given by me personally or by detailed written instruction given verbally by nursing staff using the teach back method to the patient/family/caregiver(s).  Data Reviewed/Counseling: I have reviewed the patient's vital signs, nursing notes, and other relevant tests/information. I had a detailed discussion regarding the historical points, exam findings, and any diagnostic results supporting the discharge diagnosis. I also discussed the need for outpatient follow-up and the need to return to the ED if symptoms worsen or if there are any questions or concerns that arise at home.   Final Clinical  ED Discharge Orders     None        Arthor Captain, PA-C 06/27/21 1813    Ernie Avena, MD 06/27/21 2101

## 2021-06-27 NOTE — ED Triage Notes (Signed)
Pt endorses sudden onset of midsternal CP at work today.Denies radiation, N/V, SOB. Pt states he had MI in March 2022. Pt also states that he has felt sick with head cold for several days.

## 2021-06-27 NOTE — Discharge Instructions (Addendum)
You are COVID positive. You have signs of dehydration. Make sure to drink at 8-10 10 oz glasses of water a day. Have you CREATININE rechecked in 1 week with your PCP. Return for any new or worsening symptoms.

## 2021-06-27 NOTE — ED Notes (Signed)
Pt verbalizes understanding of discharge instructions. Opportunity for questions and answers were provided. Pt discharged from the ED.   ?

## 2022-01-10 IMAGING — CR DG CHEST 2V
2 series · 2 of 2 positions shown · non-contrast
Comparison: 12/27/2020

CLINICAL DATA: Chest pain

EXAM:
CHEST - 2 VIEW

[chest pa]
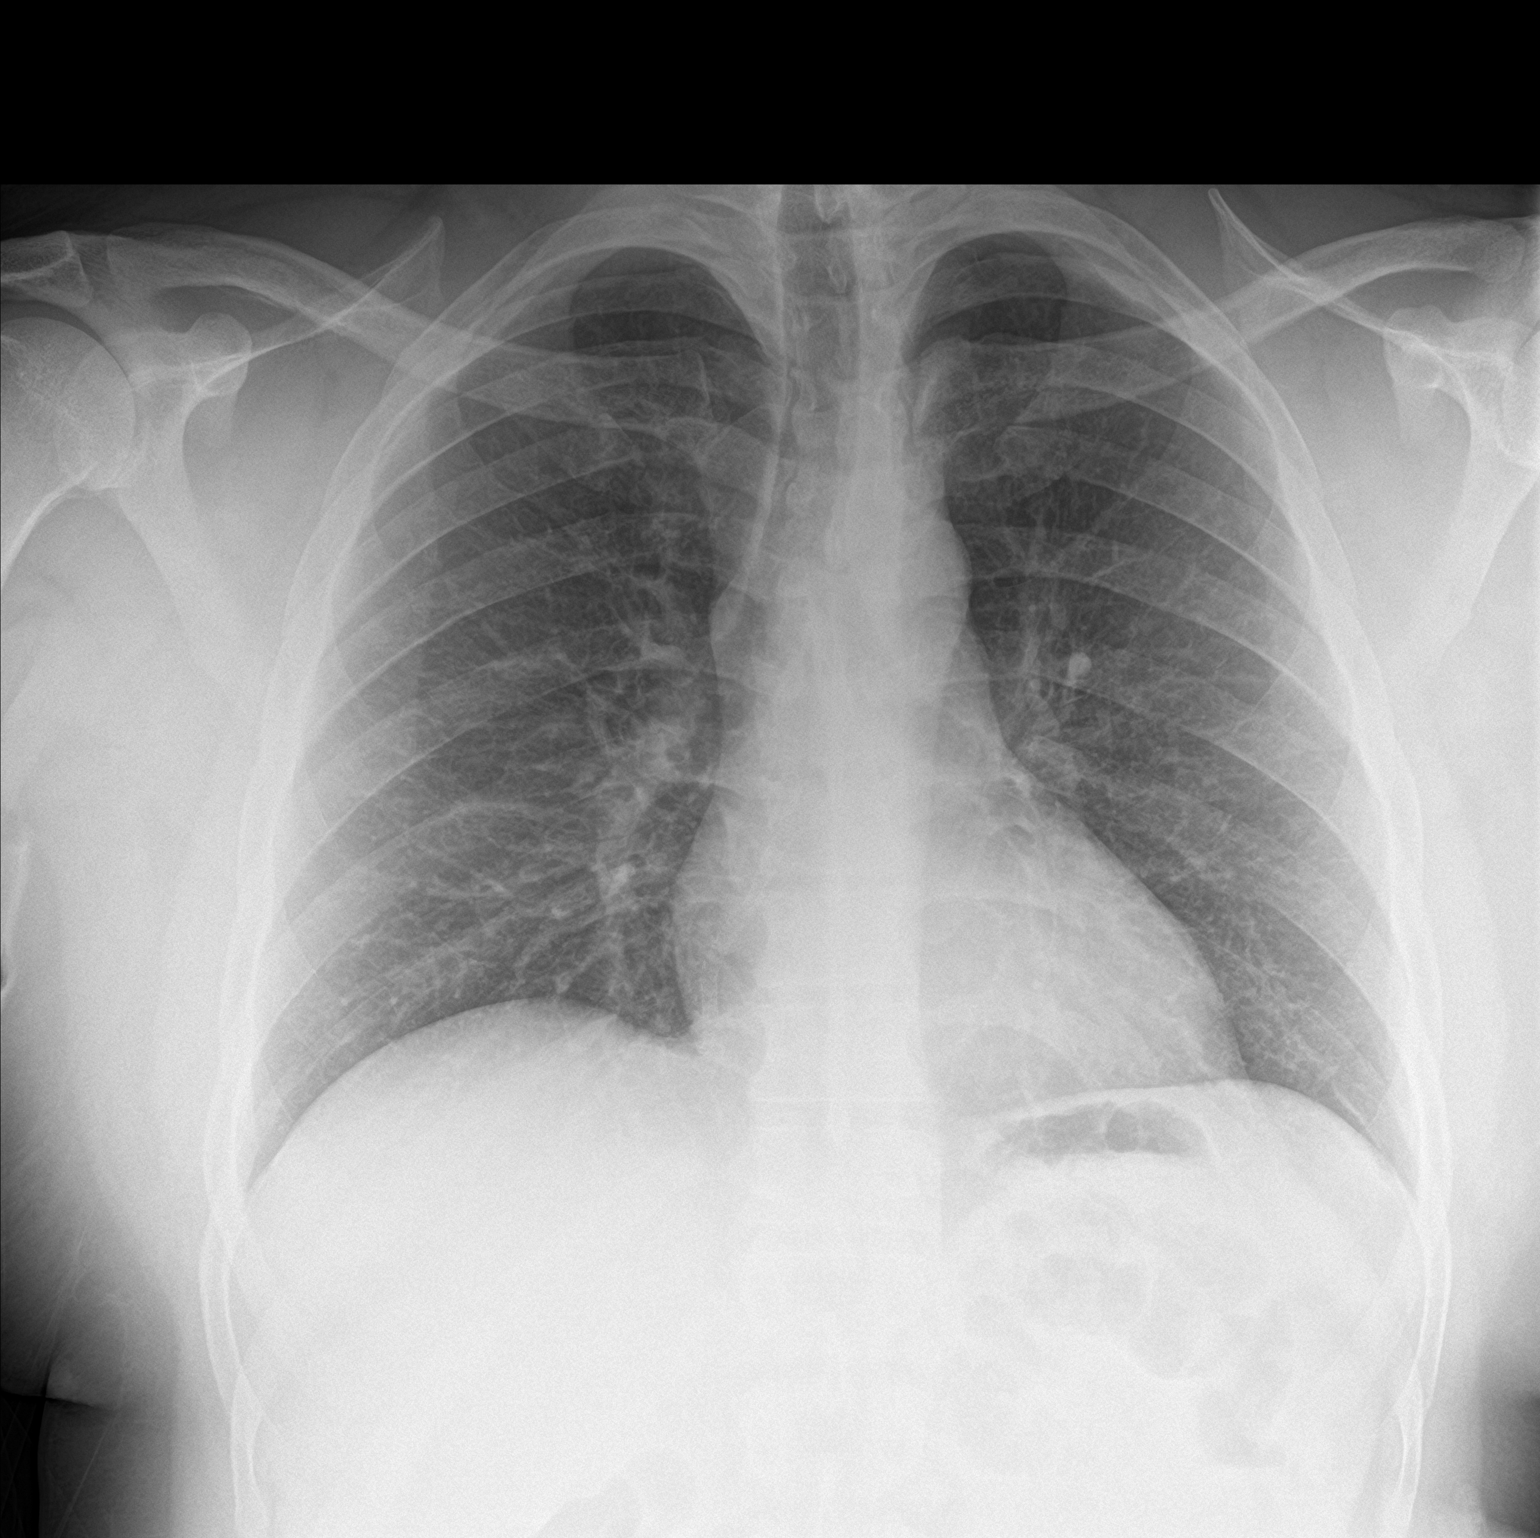

[chest lat]
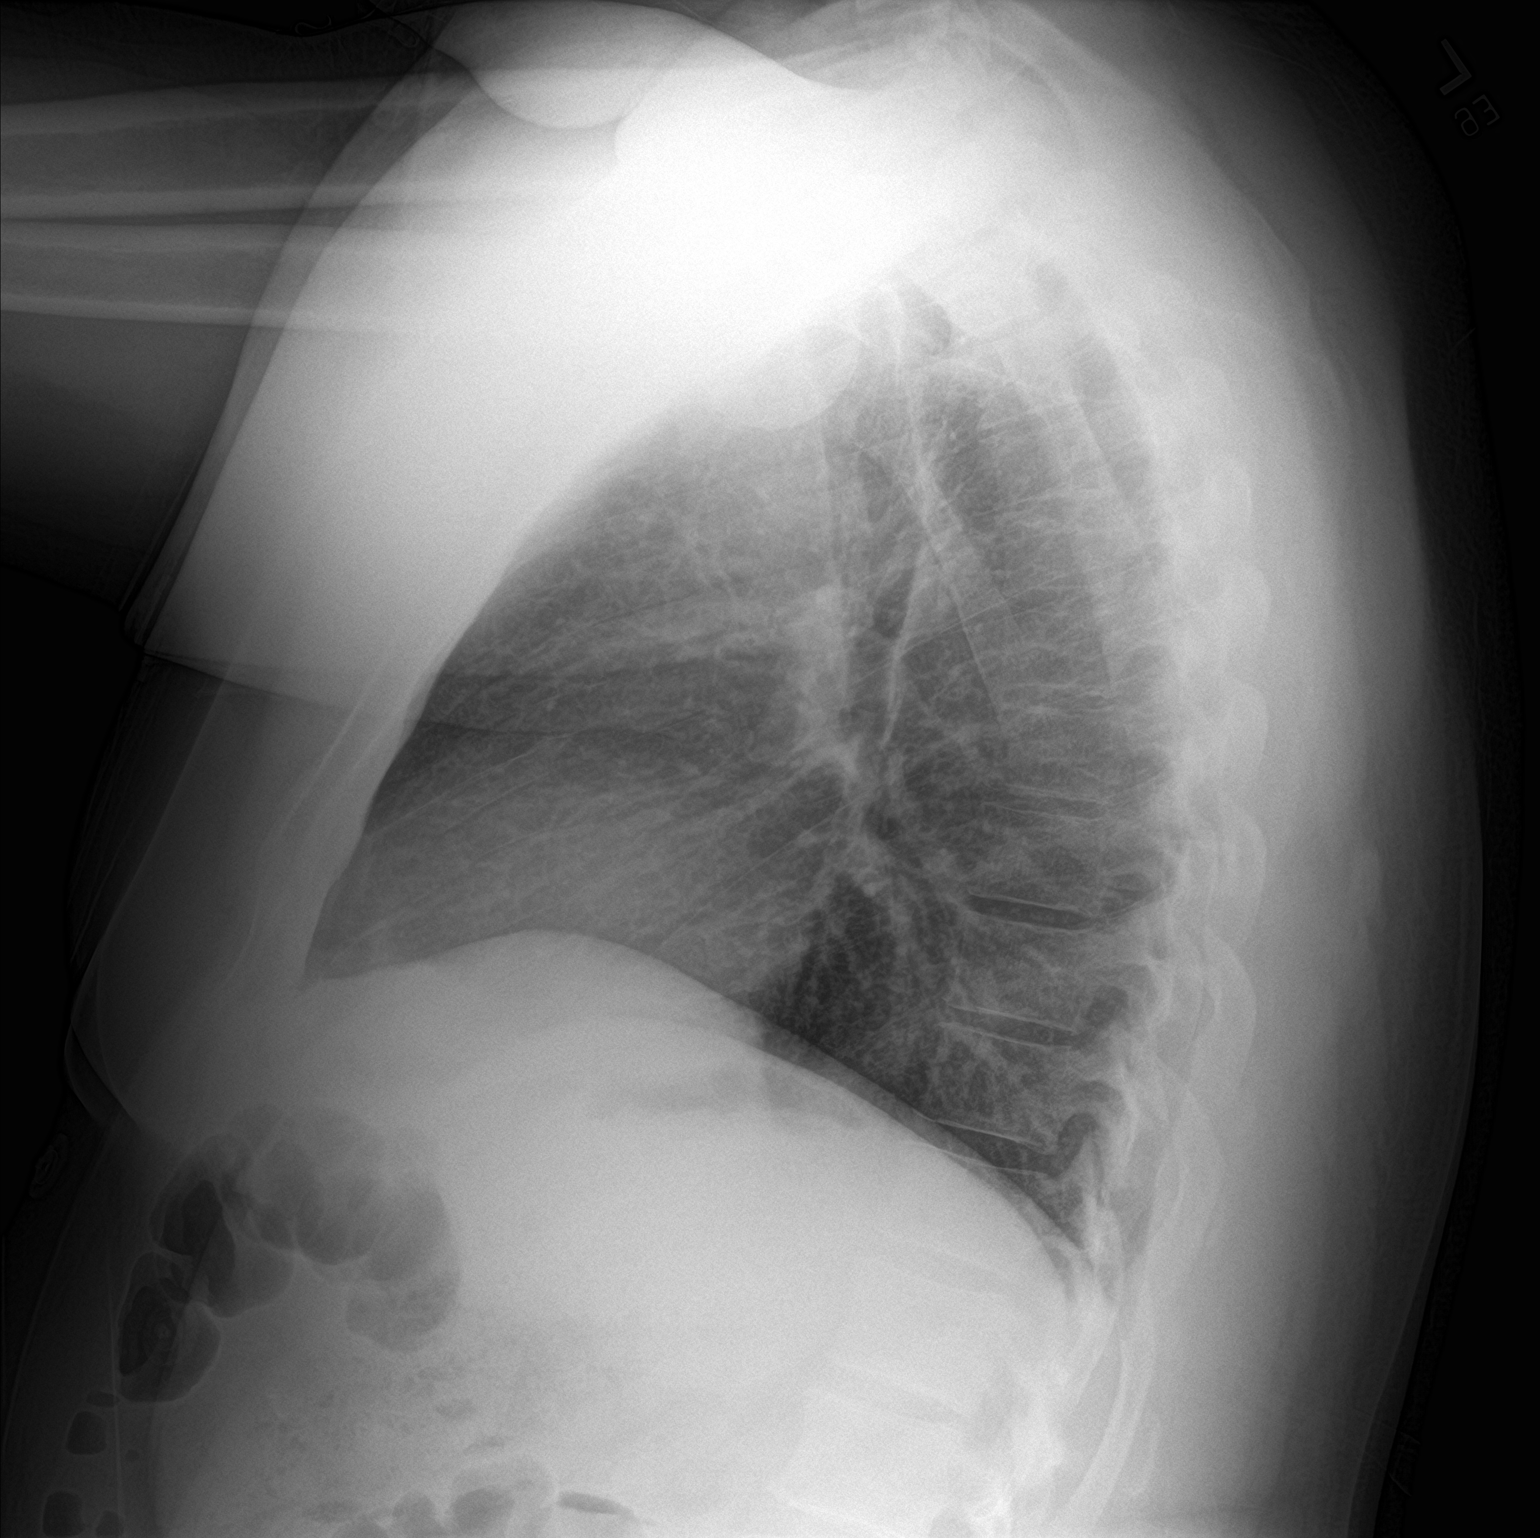

[2 of 2 positions shown; findings below may reference images not displayed]

FINDINGS: The heart size and mediastinal contours are within normal limits. No
focal airspace consolidation, pleural effusion, or pneumothorax. The
visualized skeletal structures are unremarkable.
IMPRESSION: No active cardiopulmonary disease.

## 2023-06-12 ENCOUNTER — Other Ambulatory Visit (HOSPITAL_BASED_OUTPATIENT_CLINIC_OR_DEPARTMENT_OTHER): Payer: Self-pay

## 2023-06-12 DIAGNOSIS — R5383 Other fatigue: Secondary | ICD-10-CM

## 2023-06-12 DIAGNOSIS — R0681 Apnea, not elsewhere classified: Secondary | ICD-10-CM

## 2023-06-12 DIAGNOSIS — R0683 Snoring: Secondary | ICD-10-CM

## 2023-07-22 ENCOUNTER — Encounter (HOSPITAL_COMMUNITY): Payer: Self-pay | Admitting: Emergency Medicine

## 2023-07-22 ENCOUNTER — Emergency Department (HOSPITAL_COMMUNITY)
Admission: EM | Admit: 2023-07-22 | Discharge: 2023-07-23 | Disposition: A | Discharge status: Home / Self Care | Payer: BC Managed Care – PPO | Attending: Emergency Medicine | Admitting: Emergency Medicine

## 2023-07-22 ENCOUNTER — Other Ambulatory Visit: Payer: Self-pay

## 2023-07-22 DIAGNOSIS — R7401 Elevation of levels of liver transaminase levels: Secondary | ICD-10-CM | POA: Insufficient documentation

## 2023-07-22 DIAGNOSIS — Z7984 Long term (current) use of oral hypoglycemic drugs: Secondary | ICD-10-CM | POA: Insufficient documentation

## 2023-07-22 DIAGNOSIS — I2510 Atherosclerotic heart disease of native coronary artery without angina pectoris: Secondary | ICD-10-CM | POA: Insufficient documentation

## 2023-07-22 DIAGNOSIS — I10 Essential (primary) hypertension: Secondary | ICD-10-CM | POA: Insufficient documentation

## 2023-07-22 DIAGNOSIS — H15003 Unspecified scleritis, bilateral: Secondary | ICD-10-CM | POA: Insufficient documentation

## 2023-07-22 DIAGNOSIS — E119 Type 2 diabetes mellitus without complications: Secondary | ICD-10-CM | POA: Diagnosis not present

## 2023-07-22 DIAGNOSIS — Z7982 Long term (current) use of aspirin: Secondary | ICD-10-CM | POA: Diagnosis not present

## 2023-07-22 DIAGNOSIS — H5713 Ocular pain, bilateral: Secondary | ICD-10-CM | POA: Diagnosis present

## 2023-07-22 DIAGNOSIS — Z79899 Other long term (current) drug therapy: Secondary | ICD-10-CM | POA: Insufficient documentation

## 2023-07-22 NOTE — ED Triage Notes (Signed)
Pt having bilateral eye pain, history of scleritis.

## 2023-07-23 LAB — CBC WITH DIFFERENTIAL/PLATELET
Abs Immature Granulocytes: 0.04 10*3/uL (ref 0.00–0.07)
Basophils Absolute: 0.1 10*3/uL (ref 0.0–0.1)
Basophils Relative: 1 %
Eosinophils Absolute: 0.3 10*3/uL (ref 0.0–0.5)
Eosinophils Relative: 4 %
HCT: 45.2 % (ref 39.0–52.0)
Hemoglobin: 15.3 g/dL (ref 13.0–17.0)
Immature Granulocytes: 0 %
Lymphocytes Relative: 48 %
Lymphs Abs: 4.6 10*3/uL — ABNORMAL HIGH (ref 0.7–4.0)
MCH: 29.1 pg (ref 26.0–34.0)
MCHC: 33.8 g/dL (ref 30.0–36.0)
MCV: 86.1 fL (ref 80.0–100.0)
Monocytes Absolute: 0.9 10*3/uL (ref 0.1–1.0)
Monocytes Relative: 9 %
Neutro Abs: 3.6 10*3/uL (ref 1.7–7.7)
Neutrophils Relative %: 38 %
Platelets: 318 10*3/uL (ref 150–400)
RBC: 5.25 MIL/uL (ref 4.22–5.81)
RDW: 14.1 % (ref 11.5–15.5)
WBC: 9.4 10*3/uL (ref 4.0–10.5)
nRBC: 0 % (ref 0.0–0.2)

## 2023-07-23 LAB — COMPREHENSIVE METABOLIC PANEL
ALT: 116 U/L — ABNORMAL HIGH (ref 0–44)
AST: 71 U/L — ABNORMAL HIGH (ref 15–41)
Albumin: 4.3 g/dL (ref 3.5–5.0)
Alkaline Phosphatase: 111 U/L (ref 38–126)
Anion gap: 7 (ref 5–15)
BUN: 16 mg/dL (ref 6–20)
CO2: 20 mmol/L — ABNORMAL LOW (ref 22–32)
Calcium: 9.3 mg/dL (ref 8.9–10.3)
Chloride: 107 mmol/L (ref 98–111)
Creatinine, Ser: 1.1 mg/dL (ref 0.61–1.24)
GFR, Estimated: >60 mL/min (ref >60)
Glucose, Bld: 117 mg/dL — ABNORMAL HIGH (ref 70–99)
Potassium: 4 mmol/L (ref 3.5–5.1)
Sodium: 134 mmol/L — ABNORMAL LOW (ref 135–145)
Total Bilirubin: 0.6 mg/dL (ref 0.3–1.2)
Total Protein: 7.6 g/dL (ref 6.5–8.1)

## 2023-07-23 MED ORDER — PREDNISONE 20 MG PO TABS: 40.0000 mg | ORAL_TABLET | Freq: Every day | ORAL | 0 refills | Status: AC

## 2023-07-23 MED ORDER — PREDNISONE 20 MG PO TABS
40.0000 mg | ORAL_TABLET | Freq: Once | ORAL | Status: AC
  Administered 2023-07-23: 40 mg via ORAL
  Filled 2023-07-23: qty 2

## 2023-07-23 NOTE — Discharge Instructions (Addendum)
It is important that you see your eye specialist.  Because your liver tests are high, you should not be started back on azathioprine.  There are other medications that can be used, and your eye specialist will need to choose them.  I have prescribed prednisone for you to try to reduce the inflammation in your eyes.  However, this may push your blood sugar up.  Please monitor your blood sugar closely.  If your blood sugar gets up too high, you may need to reduce the dose of prednisone.  Additionally, if you are tolerating this dose of prednisone, you may benefit from a higher dose to get better control of the inflammation in your eye.

## 2023-07-23 NOTE — ED Provider Notes (Signed)
[place] EMERGENCY DEPARTMENT AT [facility] Provider Note   CSN: [id number] Arrival date & time: 07/22/23  2052     History  Chief Complaint  Patient presents with   Eye Pain    Left/Right    Chad Blankenship is a 42 y.o. male.  The history is provided by the patient.  Eye Pain  He has history of hypertension, diabetes, hyperlipidemia, coronary artery disease, scleritis and comes in because of bilateral eye pain for the last month of which has not responded to use of over-the-counter NSAIDs.  He has been taking azathioprine twice a day, but states that it has not been helping.  He claims compliance with this.  He does express frustration with the ophthalmologist that he is seeing in [place].  He denies any visual problems.  Denies fever or chills.   Home Medications Prior to Admission medications   Medication Sig Start Date End Date Taking? Authorizing Provider  aspirin 325 MG EC tablet Take 650 mg by mouth in the morning.    [provider]  atorvastatin (LIPITOR) 80 MG tablet Take 80 mg by mouth at bedtime. 03/24/21   [provider]  azaTHIOprine (IMURAN) 50 MG tablet Take 100 mg by mouth daily. 04/01/21   [provider]  carvedilol (COREG) 6.25 MG tablet Take 6.25 mg by mouth 2 (two) times daily. 03/25/21   [provider]  cephALEXin (KEFLEX) 500 MG capsule Take 1 capsule (500 mg total) by mouth 4 (four) times daily. Patient not taking: Reported on 06/27/2021 12/04/20   Burgess Amor, PA-C  diclofenac (VOLTAREN) 75 MG EC tablet Take 1 tablet (75 mg total) by mouth 2 (two) times daily. Take with food Patient not taking: No sig reported 07/18/18   Triplett, Tammy, PA-C  doxycycline (VIBRAMYCIN) 100 MG capsule Take 1 capsule (100 mg total) by mouth 2 (two) times daily. Patient not taking: Reported on 06/27/2021 12/04/20   Burgess Amor, PA-C  hydrALAZINE (APRESOLINE) 25 MG tablet Take 25 mg by mouth 3 (three) times daily. 03/25/21    [provider]  HYDROcodone-acetaminophen (NORCO/VICODIN) 5-325 MG tablet Take 1 tablet by mouth every 4 (four) hours as needed for moderate pain. Patient not taking: Reported on 06/27/2021 12/04/20   Burgess Amor, PA-C  lisinopril (ZESTRIL) 20 MG tablet Take 20 mg by mouth daily. 06/20/21   [provider]  metFORMIN (GLUCOPHAGE) 500 MG tablet Take 500 mg by mouth 2 (two) times daily. 12/03/20   [provider]  nitroGLYCERIN (NITROSTAT) 0.4 MG SL tablet Place 0.4 mg under the tongue every 5 (five) minutes as needed for chest pain. 02/04/21   [provider]  prasugrel (EFFIENT) 10 MG TABS tablet Take 10 mg by mouth daily. 03/25/21   [provider]      Allergies    Patient has no known allergies.    Review of Systems   Review of Systems  Eyes:  Positive for pain.  All other systems reviewed and are negative.   Physical Exam Updated Vital Signs BP (!) 163/102   Pulse 64   Temp 98.3 F (36.8 C) (Oral)   Resp 17   Ht 6\' 3"  (1.905 m)   Wt (!) 147.4 kg   SpO2 92%   BMI 40.62 kg/m  Physical Exam Vitals and nursing note reviewed.   42 year old male, resting comfortably and in no acute distress. Vital signs are significant for elevated blood pressure. Oxygen saturation is 92%, which is normal. Head is  normocephalic and atraumatic.  Pupils are 4 mm on the right and 3 mm on the left, both react both directly and consensually.  Anterior chamber is clear.  Extraocular movements are full.  There is moderate scleral injection of which is greater on the right. Neck is nontender and supple without adenopathy. Lungs are clear without rales, wheezes, or rhonchi. Chest is nontender. Heart has regular rate and rhythm without murmur. Abdomen is soft, flat, nontender. Extremities have no cyanosis or edema, full range of motion is present. Skin is warm and dry without rash. Neurologic: Mental status is normal, cranial nerves are intact, moves all extremities  equally.  ED Results / Procedures / Treatments   Labs (all labs ordered are listed, but only abnormal results are displayed) Labs Reviewed  COMPREHENSIVE METABOLIC PANEL - Abnormal; Notable for the following components:      Result Value   Sodium 134 (*)    CO2 20 (*)    Glucose, Bld 117 (*)    AST 71 (*)    ALT 116 (*)    All other components within normal limits  CBC WITH DIFFERENTIAL/PLATELET - Abnormal; Notable for the following components:   Lymphs Abs 4.6 (*)    All other components within normal limits    Procedures Procedures    Medications Ordered in ED Medications  predniSONE (DELTASONE) tablet 40 mg (has no administration in time range)    ED Course/ Medical Decision Making/ A&P                                 Medical Decision Making Amount and/or Complexity of Data Reviewed Labs: ordered.  Risk Prescription drug management.   Scleritis with exacerbation.  I have reviewed his past records, and he was last seen by his ophthalmologist on 12/29/2022 with diagnosis of bilateral scleritis.  Prescription was given for azathioprine 1 month supply with 5 refills.  If he was completely compliant with his medications, he should have run out of his azathioprine about a month ago.  I suspect that medication noncompliance is a major issue.  On reviewing office notes, compliance has been an ongoing problem.  Importance of medication compliance and going for follow-up with subspecialist was stressed.  He has not had any lab work to evaluate hepatic or renal function since his last office visit.  I have ordered CBC and comprehensive metabolic panel.  I have reviewed his lab tests, and my interpretation is mild hyponatremia which is not felt to be clinically significant, elevated ALT and AST worrisome for hepatic injury from azathioprine, normal CBC.  Because of elevated transaminases, I have elected not to prescribe azathioprine for him.  I have ordered a dose of prednisone and I  am discharging him with a prescription for prednisone.  The dose I am prescribing is suboptimal for management of scleritis because of his diabetes.  If he is maintaining satisfactory glucose levels on prednisone 40 mg daily, an attempt can be made to increase the dose.  I have also advised him to monitor his glucose carefully and to reduce his dose of prednisone if his glucose is running too high.  I have stressed to the patient the importance of seeing his ophthalmologist to decide on what treatment to pursue for his scleritis.  Final Clinical Impression(s) / ED Diagnoses Final diagnoses:  Bilateral scleritis  Elevated blood pressure reading with diagnosis of hypertension  Elevated transaminase level  Rx / DC Orders ED Discharge Orders          Ordered    predniSONE (DELTASONE) 20 MG tablet  Daily        07/23/23 0321              Dione Booze, MD 07/23/23 [phone]

## 2023-07-23 NOTE — ED Notes (Signed)
Discharge instructions reviewed and newly prescribed medications discussed. Encouraged monitoring of sugars related to prednisone encouraged and notification to PCP.   Referrals provided for ophthalmology discussed.   Opportunity for questions and concerns provided.

## 2023-09-06 ENCOUNTER — Ambulatory Visit (HOSPITAL_BASED_OUTPATIENT_CLINIC_OR_DEPARTMENT_OTHER): Payer: BC Managed Care – PPO | Admitting: Internal Medicine

## 2023-12-24 ENCOUNTER — Emergency Department (HOSPITAL_COMMUNITY)
Admission: EM | Admit: 2023-12-24 | Discharge: 2023-12-24 | Discharge status: Against Medical Advice | Payer: 59 | Attending: Emergency Medicine | Admitting: Emergency Medicine

## 2023-12-24 ENCOUNTER — Encounter (HOSPITAL_COMMUNITY): Payer: Self-pay

## 2023-12-24 ENCOUNTER — Other Ambulatory Visit: Payer: Self-pay

## 2023-12-24 DIAGNOSIS — R519 Headache, unspecified: Secondary | ICD-10-CM | POA: Diagnosis present

## 2023-12-24 DIAGNOSIS — J101 Influenza due to other identified influenza virus with other respiratory manifestations: Secondary | ICD-10-CM | POA: Diagnosis not present

## 2023-12-24 DIAGNOSIS — Z5321 Procedure and treatment not carried out due to patient leaving prior to being seen by health care provider: Secondary | ICD-10-CM | POA: Diagnosis not present

## 2023-12-24 HISTORY — DX: Essential (primary) hypertension: I10

## 2023-12-24 LAB — RESP PANEL BY RT-PCR (RSV, FLU A&B, COVID)  RVPGX2
Influenza A by PCR: POSITIVE — AB
Influenza B by PCR: NEGATIVE
Resp Syncytial Virus by PCR: NEGATIVE
SARS Coronavirus 2 by RT PCR: NEGATIVE

## 2023-12-24 LAB — CBG MONITORING, ED: Glucose-Capillary: 135 mg/dL — ABNORMAL HIGH (ref 70–99)

## 2023-12-24 NOTE — ED Triage Notes (Signed)
Pt to ED with c/o headache and vision blurry that started Thursday night, pt "thinks his sugar may be high" but hasn't checked it, didn't take metformin today, says he took it yesterday.

## 2024-03-02 ENCOUNTER — Encounter (HOSPITAL_BASED_OUTPATIENT_CLINIC_OR_DEPARTMENT_OTHER): Payer: Self-pay

## 2024-03-02 ENCOUNTER — Ambulatory Visit (HOSPITAL_BASED_OUTPATIENT_CLINIC_OR_DEPARTMENT_OTHER): Payer: Self-pay | Attending: Family Medicine | Admitting: Internal Medicine

## 2024-03-02 DIAGNOSIS — R5383 Other fatigue: Secondary | ICD-10-CM | POA: Insufficient documentation

## 2024-03-02 DIAGNOSIS — G4733 Obstructive sleep apnea (adult) (pediatric): Secondary | ICD-10-CM | POA: Diagnosis not present

## 2024-03-02 DIAGNOSIS — R0683 Snoring: Secondary | ICD-10-CM | POA: Diagnosis present

## 2024-03-02 DIAGNOSIS — R0681 Apnea, not elsewhere classified: Secondary | ICD-10-CM

## 2024-03-11 DIAGNOSIS — R0683 Snoring: Secondary | ICD-10-CM | POA: Diagnosis not present

## 2024-03-11 NOTE — Procedures (Signed)
                             [employer] Diplomate, Biomedical engineer of Sleep Medicine  ELECTRONICALLY SIGNED ON:  03/11/2024, 12:06 PM [facility] PH: [phone]   FX: [phone] ACCREDITED BY THE AMERICAN ACADEMY OF SLEEP MEDICINE

## 2024-03-11 NOTE — Procedures (Signed)
 [facility] [address] [place], [place] [address] Tel: [phone]   Fax: [phone]  Split Night Interpretation  Patient Name:  Chad, Blankenship Date:  03/02/2024 Referring Physician:  Danella Dunn MD  Indications for Polysomnography The patient is a 43 year old Male who is 6\' 3"  and weighs 232.0 lbs.  His BMI equals 29.2.  A diagnostic polysomnogram was performed to evaluate for -OSA.  After 145.0 minutes of sleep time the patient exhibited sufficient respiratory events qualifying him for a CPAP trial which was then initiated.    Medication  No Data.   Polysomnogram Data A full night polysomnogram was performed recording the standard physiologic parameters including EEG, EOG, EMG, EKG, nasal and oral airflow.  Respiratory parameters of chest and abdominal movements are recorded with [place]Crystal motion transducers.  Oxygen saturation was recorded by pulse oximetry.    Sleep Architecture The total recording time of the diagnostic portion of the study was 174.1 minutes.  The total sleep time was 145.0 minutes.  During the diagnostic portion of the study, the patient spent 3.1% of total sleep time in Stage N1, 96.9% in Stage N2, 0.0% in Stages N3, and 0.0% in REM.   Sleep latency was 0.0 minutes.  REM latency was - minutes.  Sleep Efficiency was 83.3%.  Wake after Sleep Onset time was 29.5 minutes.   At 12:31:51 AM the patient was placed on PAP treatment and was titrated at pressures ranging from 5* cm/H20 with supplemental oxygen at - up to 18* cm/H20 with supplemental oxygen at -.  The total recording time of the treatment portion of the study was 214.4 minutes.  The total sleep time was 189.5 minutes.  During the treatment portion of the study, the patient spent 2.1% of total sleep time in Stage N1, 66.0% in Stage N2, 0.0% in Stages N3, and 31.9% in REM.   Sleep latency was 4.0 minutes.  REM latency was 84.0 minutes.  Sleep Efficiency  was 88.4%.  Wake after Sleep Onset time was 21.0 minutes.  Respiratory Events During the diagnostic portion of the study, the polysomnogram revealed a presence of 220 obstructive, - central, and - mixed apneas resulting in an Apnea index of 91.0 events per hour.  There were 18 hypopneas (>=3% desaturation and/or arousal) resulting in an Apnea\Hypopnea Index (AHI >=3% desaturation and/or arousal) of 98.5 events per hour.  There were 15 hypopneas (>=4% desaturation) resulting in an Apnea\Hypopnea Index (AHI >=4% desaturation) of 97.2 events per hour.  There were - Respiratory Effort Related Arousals resulting in a RERA index of - events per hour. The Respiratory Disturbance Index is 98.5 events per hour.  The snore index was - events per hour.  Mean oxygen saturation was 89.3%.  The lowest oxygen saturation during sleep was 74.0%.  Time spent <=88% oxygen saturation was 66.2 minutes (39.5%).  End Tidal CO2 during sleep ranged from - to - mmHg. End Tidal CO2 was greater than 50 mmHg for - minutes and greater than 55 mmHg for - minutes.  During the treatment portion of the study, the polysomnogram revealed a presence of 1 obstructive, - central, and - mixed apneas resulting in an Apnea index of 0.3 events per hour.  There were 106 hypopneas (>=3% desaturation and/or arousal) resulting in an Apnea\Hypopnea Index (AHI >=3% desaturation and/or arousal) of 33.9 events per hour.  There were 89 hypopneas (>=4% desaturation) resulting in an Apnea\Hypopnea Index (AHI >=4% desaturation) of 28.5 events  per hour.  There were - Respiratory Effort Related Arousals resulting in a RERA index of - events per hour. The Respiratory Disturbance Index is 33.9 events per hour.  The snore index was - events per hour.  Mean oxygen saturation was 93.7%.  The lowest oxygen saturation during sleep was 83.0%.  Time spent <=88% oxygen saturation was 13.1 minutes (6.2%).  Limb Activity During the diagnostic portion of the study, there were  - limb movements recorded.  Of this total, - were classified as PLMs.  Of the PLMs, - were associated with arousals.  The Limb Movement index was - per hour while the PLM index was - per hour.  During the treatment portion of the study, there were - limb movements recorded.  Of this total, - were classified as PLMs.  Of the PLMs, - were associated with arousals.  The Limb Movement index was - per hour while the PLM index was - per hour.  Cardiac Summary During the diagnostic portion of the study, the average pulse rate was 77.7 bpm.  The minimum pulse rate was 53.0 bpm while the maximum pulse rate was 103.0 bpm.  During the treatment portion of the study, the average pulse rate was 73.0 bpm.  The minimum pulse rate was 53.0 bpm while the maximum pulse rate was 98.0 bpm.   Comment: Severe obstructive sleep apnea, AHI (4%) 97.2/hr. Snoring with oxygen desaturation to a nadir of 74% with mean 89.3%. CPAP titration to 18 cwp with residual AHI (4%) 0/hr, minimum O2 saturation 94%.  Diagnosis: Obstructive sleep apnea  Recommendations: Suggest autopap 10-20, or fixed CPAP 18 cwp. Patient wore a large F&P Simplus full-face mask with heated humidification.   This study was personally reviewed and electronically signed by: [employer] MD Accredited Board Certified in Sleep Medicine Date/Time: 03/11/24  12: 12       Split Night Report  Patient Name: Chad Blankenship, Chad Blankenship Date: 03/02/2024  Date of Birth: 01-05-1981 Study Type: Split Night  Age: 43 year MRN #: [id number]  Sex: Male Interpreting Physician: [employer] [id number]  Height: 6\' 3"  Referring Physician: Danella Dunn MD  Weight: 232.0 lbs Recording Tech: Metro Acron RPSGT RST  BMI: 29.2 Scoring Tech: Metro Acron RPSGT RST  ESS: 17 Neck Size: 18  Mask Type Simplus Full Face Mask Final Pressure: 18cmH20  Mask Size: Large Supplemental O2: M/A   Study Overview  DIAGNOSTIC TREATMENT  Lights Off: 09:37:44 PM Lights Off: 12:31:48  AM  Lights On: 12:31:48 AM Lights On: 04:06:10 AM  Time in Bed: 174.1 min. Time in Bed: 214.4 min.  Total Sleep Time: 145.0 min. Total Sleep Time: 189.5 min.  Sleep Efficiency: 83.3% Sleep Efficiency: 88.4%  Sleep Latency: 0.0 min. Sleep Latency: 4.0 min.  REM Latency from Sleep Onset: - min. REM Latency from Sleep Onset: 84.0 min.  Wake After Sleep Onset: 29.5 min. Wake After Sleep Onset: 21.0 min.   DIAGNOSTIC TREATMENT   Count Index  Count Index  Awakenings: 12 5.0 Awakenings: 8 2.5  Arousals: 5 2.1 Arousals: - -  AHI (>=3% Desat and/or Ar.): 238 98.5 AHI (>=3% Desat and/or Ar.): 107 33.9  AHI (>=4% Desat): 235 97.2 AHI (>=4% Desat): 90 28.5   Limb Movements: - - Limb Movements: - -  Snore: - - Snore: - -  Desaturations: 239 98.9 Desaturations: 110 34.8  Minimum SpO2 TST: 74.0% Minimum SpO2 TST: 83.0%    Sleep Architecture   DIAGNOSTIC TREATMENT ENTIRE NIGHT  Stages Time (mins) % Sleep  Time Time (mins) % Sleep Time Time (mins) % Sleep Time  Wake 29.5  25.0  54.5   Stage N1 4.5 3.1% 4.0 2.1% 8.5 2.5%  Stage N2 140.5 96.9% 125.0 66.0% 265.5 79.4%  Stage N3 0.0 0.0% 0.0 0.0% 0.0 0.0%  REM 0.0 0.0% 60.5 31.9% 60.5 18.1%   Arousal Summary   DIAGNOSTIC TREATMENT   NREM REM TST Index NREM REM TST Index  Respiratory Ar. 5 - 5 2.1 - - - -  PLM Ar. - - - - - - - -  Isolated Limb Movement Ar. - - - - - - - -  Snore Ar. - - - - - - - -  Spontaneous Ar. - - - - - - - -  Total Ar. 5 - 5 2.1 - - - -    Respiratory Summary  DIAGNOSTIC By Sleep Stage By Body Position Total   NREM REM Supine Non-Supine   Time (min) 145.0 0.0 40.5 104.5 145.0         Obstructive Apnea 220 - 56 164 220  Mixed Apnea - - - - -  Central Apnea - - - - -  Total Apneas 220 - 56 164 220  Total Apnea Index 91.0 - 83.0 94.2 91.0         Hypopneas (>=3% Desat and/or Ar.) 18 - 3 15 18   AHI (>=3% Desat and/or Ar.) 98.5 - 87.4 102.8 98.5         Hypopneas (>=4% Desat) 15 - 1 14 15   AHI (>=4% Desat) 97.2 -  84.4 102.2 97.2          RERAs - - - - -  RERA Index - - - - -         RDI 98.5 - 87.4 102.8 98.5    TREATMENT By Sleep Stage By Body Position Total   NREM REM Supine Non-Supine   Time (min) 129.0 60.5 162.5 27.0 189.5         Obstructive Apnea 1 - - 1 1  Mixed Apnea - - - - -  Central Apnea - - - - -  Total Apneas 1 - - 1 1  Total Apnea Index 0.5 - - 2.2 0.3         Hypopneas (>=3% Desat and/or Ar.) 93 13 65 41 106  AHI (>=3% Desat and/or Ar.) 43.7 12.9 24.0 93.3 33.9         Hypopneas (>=4% Desat) 85 4 52 37 89  AHI (>=4% Desat) 40.0 4.0 19.2 84.4 28.5          RERAs - - - - -  RERA Index - - - - -         RDI 43.7 12.9 24.0 93.3 33.9    Respiratory Event Durations   DIAGNOSTIC TREATMENT  Apnea NREM REM NREM REM  Average (seconds) 24.5 - 18.2 -  Maximum (seconds) 41.0 - 18.2 -  Hypopnea      Average (seconds) 22.6 - 25.5 33.1  Maximum (seconds) 36.4 - 50.8 52.8    Limb Movement Summary   DIAGNOSTIC TREATMENT   Count Index Count Index  Isolated Limb Movements - - - -  Periodic Limb Movements (PLMs) - - - -  Total Limb Movements - - - -    Oxygen Saturation Summary   DIAGNOSTIC TREATMENT   Wake NREM REM TST Wake NREM REM TST  Average SpO2 92.3% 88.8% - 88.8% 94.8% 93.5% 93.9% 93.6%  Minimum SpO2 75.0%  74.0% - 74.0%  86.0% 83.0% 88.0% 83.0%   Maximum SpO2 99.0% 98.0% - 98.0%  98.0% 98.0% 97.0% 98.0%    DIAGNOSTIC Oxygen Saturation Distribution  Range (%) Time in range (min) Time in range (%)   90.0 - 100.0 85.8 51.3%  80.0 - 90.0 65.9 39.4%  70.0 - 80.0 15.6 9.3%  60.0 - 70.0 - -  50.0 - 60.0 - -  0.0 - 50.0 - -  Time Spent <=88% SpO2  Range (%) Time in range (min) Time in range (%)  0.0 - 88.0 66.2 39.5%      Count Index  Desaturations: 239 98.9   TREATMENT Oxygen Saturation Distribution  Range (%) Time in range (min) Time in range (%)   90.0 - 100.0 190.4 90.6%  80.0 - 90.0 19.9 9.4%  70.0 - 80.0 - -  60.0 - 70.0 - -  50.0 - 60.0 - -   0.0 - 50.0 - -  Time Spent <=88% SpO2  Range (%) Time in range (min) Time in range (%)  0.0 - 88.0 13.1 6.2%      Count Index  Desaturations: 110 34.8     Cardiac Summary   DIAGNOSTIC TREATMENT   Wake NREM REM Total Wake NREM REM Total  Average Pulse Rate (BPM) 82.5 76.9 - 77.7 81.5 71.5 73.3 73.0  Minimum Pulse Rate (BPM) 53.0 56.0 - 53.0 59.0 55.0 53.0 53.0  Maximum Pulse Rate (BPM) 103.0 100.0 - 103.0 98.0 88.0 90.0 98.0   Pulse Rate Distribution   DIAGNOSTIC  Range (bpm) Time in range (min) Time in range (%)  0.0 - 40.0 - -  40.0 - 60.0 2.1 1.3%  60.0 - 80.0 97.5 58.2%  80.0 - 100.0 68.0 40.5%  100.0 - 120.0 0.1 0.0%  120.0 - 140.0 - -  140.0 - 200.0 - -   TREATMENT  Range (bpm) Time in range (min) Time in range (%)  0.0 - 40.0 - -  40.0 - 60.0 2.3 1.1%  60.0 - 80.0 179.0 85.1%  80.0 - 100.0 29.0 13.8%  100.0 - 120.0 - -  120.0 - 140.0 - -  140.0 - 200.0 - -   EtCO2 Summary - Diagnostic  Stage Min (mmHg) Average (mmHg) Max (mmHg)  Wake - - -  NREM(1+2+3) - - -  REM - - -   EtCO2 Distribution:  Range (mmHg) Time in range (min) Time in range (%)  20.0 - 40.0 - -  40.0 - 50.0 - -  50.0 - 100.0 - -  55.0 - 100.0 - -  Excluded data <20.0 & >65.0 174.5 100.0%   Titration Summary  PAP Device PAP Level O2 Level Time (min) Wake (min) NREM (min) REM (min) Sleep Eff% OA# CA# MA# Hyp# (>=3%) AHI (>=3%) Hyp# (>=4%) AHI (>=%4) RERA RDI OSat <=88% (min) Min [employer] Ar. Index  - Off - 174.5 29.5 145.0 0.0 83.1% 220 - - 18 98.5 15  97.2 -  98.5  62.5 74.0 88.8 2.1  CPAP 5 - 7.0 4.5 2.5 0.0 35.7% 1 - - 4 120.0 2  72.0 -  120.0  0.1 88.0 93.3 -  CPAP 7 - 19.0 1.5 17.5 0.0 92.1% - - - 26 89.1 24  82.3 -  89.1  4.6 83.0 91.6 -  CPAP 9 - 17.5 0.0 17.5 0.0 100.0% - - - 26 89.1 26  89.1 -  89.1  4.6 84.0 92.0 -  CPAP 11 - 51.5 16.5 28.0 7.0  68.0% - - - 34 58.3 30  51.4 -  58.3  3.0 84.0 93.3 -  CPAP 13 - 44.5 1.5 0.0 43.0 96.6% - - - 8 11.2 4  5.6 -  11.2  0.1  88.0 93.9 -  CPAP 15 - 15.0 0.5 4.0 10.5 96.7% - - - 4 16.6 2  8.3 -  16.6  0.0 90.0 94.5 -  CPAP 16 - 38.5 0.5 38.0 0.0 98.7% - - - 4 6.3 1  1.6 -  6.3  0.0 92.0 94.7 -  CPAP 17 - 10.0 0.0 10.0 0.0 100.0% - - - - - -  - -  -  0.0 93.0 94.0 -  CPAP 18 - 11.5 0.0 11.5 0.0 100.0% - - - - - -  - -  -  0.0 94.0 94.2 -    Hypnograms                           Technologist Comments  STUDY TYPE= SPLIT Mask= SIMPLUS FULL FACE MASK Pressure= 18cmH20 Oxygen= N/A  Patient Present to the lab for Split Night Study. Tech Explained the Process of Hook up and Wires. Patient Expressed clear understanding. Hook up was smooth. Patient did not have difficulty falling asleep. Tech observed Moderate to Severe Respiratory events. Split was initiated. Pressure was started on 5cmH20. Patient Exhibit Significant Events. Pressure was increased accordingly. Optimal Pressure was reached at 18cmH20.  Bathroom break was taking twice, Patient slept left, right and supine. Patient tolerated the mask No significant PLMs -TECH                            Kambry Takacs [employer], Biomedical engineer of Sleep Medicine  ELECTRONICALLY SIGNED ON:  03/11/2024, 12:06 PM [facility] PH: [phone]   FX: [phone] ACCREDITED BY THE AMERICAN ACADEMY OF SLEEP MEDICINE

## 2024-05-19 ENCOUNTER — Encounter: Payer: Self-pay | Admitting: Advanced Practice Midwife
